# Patient Record
Sex: Male | Born: 1960
Health system: Southern US, Community
[De-identification: ages and names within clinical notes are randomized; demographics above are authoritative.]

## PROBLEM LIST (undated history)

## (undated) DIAGNOSIS — F329 Major depressive disorder, single episode, unspecified: Secondary | ICD-10-CM

## (undated) DIAGNOSIS — F32A Depression, unspecified: Secondary | ICD-10-CM

## (undated) DIAGNOSIS — I10 Essential (primary) hypertension: Secondary | ICD-10-CM

## (undated) DIAGNOSIS — D589 Hereditary hemolytic anemia, unspecified: Secondary | ICD-10-CM

## (undated) DIAGNOSIS — D693 Immune thrombocytopenic purpura: Secondary | ICD-10-CM

## (undated) HISTORY — DX: Hereditary hemolytic anemia, unspecified: D58.9

---

## 2006-08-25 ENCOUNTER — Inpatient Hospital Stay (HOSPITAL_COMMUNITY): Admission: AD | Admit: 2006-08-25 | Discharge: 2006-08-30 | Payer: Self-pay | Admitting: Internal Medicine

## 2006-08-30 ENCOUNTER — Ambulatory Visit: Payer: Self-pay | Admitting: Hematology & Oncology

## 2006-08-31 ENCOUNTER — Ambulatory Visit: Payer: Self-pay | Admitting: Oncology

## 2006-09-02 LAB — CBC WITH DIFFERENTIAL (CANCER CENTER ONLY)
BASO#: 0.1 10*3/uL (ref 0.0–0.2)
HCT: 49.1 % (ref 38.7–49.9)
HGB: 16.5 g/dL (ref 13.0–17.1)
LYMPH#: 1.1 10*3/uL (ref 0.9–3.3)
MONO#: 0.6 10*3/uL (ref 0.1–0.9)
NEUT%: 86.1 % — ABNORMAL HIGH (ref 40.0–80.0)
RBC: 5.46 10*6/uL (ref 4.20–5.70)
WBC: 14 10*3/uL — ABNORMAL HIGH (ref 4.0–10.0)

## 2006-09-02 LAB — COMPREHENSIVE METABOLIC PANEL
ALT: 29 U/L (ref 0–53)
AST: 29 U/L (ref 0–37)
Creatinine, Ser: 1.24 mg/dL (ref 0.40–1.50)
Total Bilirubin: 0.7 mg/dL (ref 0.3–1.2)

## 2006-09-10 LAB — CBC WITH DIFFERENTIAL (CANCER CENTER ONLY)
BASO#: 0.2 10*3/uL (ref 0.0–0.2)
EOS%: 1.4 % (ref 0.0–7.0)
Eosinophils Absolute: 0.2 10*3/uL (ref 0.0–0.5)
HCT: 44.1 % (ref 38.7–49.9)
HGB: 15 g/dL (ref 13.0–17.1)
LYMPH#: 2 10*3/uL (ref 0.9–3.3)
MCH: 30.7 pg (ref 28.0–33.4)
MCHC: 34 g/dL (ref 32.0–35.9)
MONO%: 5 % (ref 0.0–13.0)
NEUT%: 79.2 % (ref 40.0–80.0)

## 2006-09-17 ENCOUNTER — Ambulatory Visit: Payer: Self-pay | Admitting: Oncology

## 2006-09-17 LAB — CBC WITH DIFFERENTIAL/PLATELET
Basophils Absolute: 0.1 10*3/uL (ref 0.0–0.1)
EOS%: 0.1 % (ref 0.0–7.0)
HCT: 42 % (ref 38.7–49.9)
HGB: 15 g/dL (ref 13.0–17.1)
LYMPH%: 6.8 % — ABNORMAL LOW (ref 14.0–48.0)
MCH: 31.3 pg (ref 28.0–33.4)
MCV: 87.5 fL (ref 81.6–98.0)
MONO%: 1.4 % (ref 0.0–13.0)
NEUT%: 91.1 % — ABNORMAL HIGH (ref 40.0–75.0)
Platelets: 309 10*3/uL (ref 145–400)
lymph#: 0.8 10*3/uL — ABNORMAL LOW (ref 0.9–3.3)

## 2006-09-24 LAB — CBC WITH DIFFERENTIAL/PLATELET
Basophils Absolute: 0 10*3/uL (ref 0.0–0.1)
EOS%: 0.4 % (ref 0.0–7.0)
HGB: 14.6 g/dL (ref 13.0–17.1)
MCH: 31.4 pg (ref 28.0–33.4)
MCV: 88.2 fL (ref 81.6–98.0)
MONO%: 2.6 % (ref 0.0–13.0)
RDW: 13.7 % (ref 11.2–14.6)

## 2006-09-26 ENCOUNTER — Ambulatory Visit: Payer: Self-pay | Admitting: Oncology

## 2006-10-01 LAB — CBC WITH DIFFERENTIAL/PLATELET
BASO%: 0.2 % (ref 0.0–2.0)
EOS%: 0.5 % (ref 0.0–7.0)
Eosinophils Absolute: 0 10*3/uL (ref 0.0–0.5)
MCH: 31 pg (ref 28.0–33.4)
MCV: 87.7 fL (ref 81.6–98.0)
MONO%: 4.5 % (ref 0.0–13.0)
NEUT#: 6.7 10*3/uL — ABNORMAL HIGH (ref 1.5–6.5)
RBC: 4.61 10*6/uL (ref 4.20–5.71)
RDW: 13.7 % (ref 11.2–14.6)

## 2006-10-09 LAB — CBC WITH DIFFERENTIAL (CANCER CENTER ONLY)
BASO#: 0.1 10*3/uL (ref 0.0–0.2)
EOS%: 2.8 % (ref 0.0–7.0)
HGB: 14.6 g/dL (ref 13.0–17.1)
LYMPH%: 30 % (ref 14.0–48.0)
MCH: 31.1 pg (ref 28.0–33.4)
MCHC: 33.9 g/dL (ref 32.0–35.9)
MCV: 92 fL (ref 82–98)
MONO%: 7.9 % (ref 0.0–13.0)
NEUT#: 3.7 10*3/uL (ref 1.5–6.5)

## 2006-10-09 LAB — BASIC METABOLIC PANEL
CO2: 28 mEq/L (ref 19–32)
Calcium: 9.8 mg/dL (ref 8.4–10.5)
Glucose, Bld: 143 mg/dL — ABNORMAL HIGH (ref 70–99)
Sodium: 138 mEq/L (ref 135–145)

## 2006-10-15 LAB — CBC WITH DIFFERENTIAL/PLATELET
BASO%: 1.5 % (ref 0.0–2.0)
EOS%: 1.9 % (ref 0.0–7.0)
HCT: 41.5 % (ref 38.7–49.9)
LYMPH%: 23.2 % (ref 14.0–48.0)
MCH: 31.2 pg (ref 28.0–33.4)
MCHC: 35.2 g/dL (ref 32.0–35.9)
MCV: 88.4 fL (ref 81.6–98.0)
MONO%: 11.2 % (ref 0.0–13.0)
NEUT%: 62.2 % (ref 40.0–75.0)
lymph#: 1.5 10*3/uL (ref 0.9–3.3)

## 2006-10-22 LAB — CBC WITH DIFFERENTIAL/PLATELET
BASO%: 0.8 % (ref 0.0–2.0)
EOS%: 2.3 % (ref 0.0–7.0)
HGB: 14.8 g/dL (ref 13.0–17.1)
MCH: 31 pg (ref 28.0–33.4)
MCHC: 35.4 g/dL (ref 32.0–35.9)
MONO%: 13.1 % — ABNORMAL HIGH (ref 0.0–13.0)
RBC: 4.78 10*6/uL (ref 4.20–5.71)
RDW: 13 % (ref 11.2–14.6)
lymph#: 1.7 10*3/uL (ref 0.9–3.3)

## 2006-10-27 ENCOUNTER — Ambulatory Visit: Payer: Self-pay | Admitting: Oncology

## 2006-10-29 LAB — CBC WITH DIFFERENTIAL/PLATELET
Basophils Absolute: 0.1 10*3/uL (ref 0.0–0.1)
EOS%: 2.6 % (ref 0.0–7.0)
Eosinophils Absolute: 0.1 10*3/uL (ref 0.0–0.5)
HGB: 14.7 g/dL (ref 13.0–17.1)
NEUT#: 3 10*3/uL (ref 1.5–6.5)
RBC: 4.68 10*6/uL (ref 4.20–5.71)
RDW: 13.1 % (ref 11.2–14.6)
WBC: 5.3 10*3/uL (ref 4.0–10.0)
lymph#: 1.6 10*3/uL (ref 0.9–3.3)

## 2006-11-26 ENCOUNTER — Ambulatory Visit: Payer: Self-pay | Admitting: Oncology

## 2006-11-30 LAB — CBC WITH DIFFERENTIAL (CANCER CENTER ONLY)
BASO%: 0.7 % (ref 0.0–2.0)
EOS%: 3.3 % (ref 0.0–7.0)
Eosinophils Absolute: 0.2 10*3/uL (ref 0.0–0.5)
LYMPH%: 33.4 % (ref 14.0–48.0)
MCH: 30.2 pg (ref 28.0–33.4)
MCHC: 34 g/dL (ref 32.0–35.9)
MCV: 89 fL (ref 82–98)
MONO%: 6.7 % (ref 0.0–13.0)
NEUT#: 2.8 10*3/uL (ref 1.5–6.5)
Platelets: 280 10*3/uL (ref 145–400)
RBC: 5 10*6/uL (ref 4.20–5.70)
RDW: 11.1 % (ref 10.5–14.6)

## 2007-05-19 ENCOUNTER — Ambulatory Visit: Payer: Self-pay | Admitting: Oncology

## 2007-05-20 LAB — CBC WITH DIFFERENTIAL (CANCER CENTER ONLY)
BASO%: 1.4 % (ref 0.0–2.0)
Eosinophils Absolute: 0.1 10*3/uL (ref 0.0–0.5)
LYMPH%: 24.2 % (ref 14.0–48.0)
MONO#: 0.4 10*3/uL (ref 0.1–0.9)
NEUT#: 3.4 10*3/uL (ref 1.5–6.5)
Platelets: 305 10*3/uL (ref 145–400)
RBC: 5.29 10*6/uL (ref 4.20–5.70)
RDW: 12.3 % (ref 10.5–14.6)
WBC: 5.3 10*3/uL (ref 4.0–10.0)

## 2007-05-21 LAB — ANA: Anti Nuclear Antibody(ANA): NEGATIVE

## 2007-11-03 ENCOUNTER — Ambulatory Visit: Payer: Self-pay | Admitting: Oncology

## 2007-11-04 LAB — CBC WITH DIFFERENTIAL (CANCER CENTER ONLY)
BASO%: 1 % (ref 0.0–2.0)
HCT: 45.6 % (ref 38.7–49.9)
LYMPH#: 1.4 10*3/uL (ref 0.9–3.3)
MONO#: 0.3 10*3/uL (ref 0.1–0.9)
Platelets: 297 10*3/uL (ref 145–400)
RDW: 12.8 % (ref 10.5–14.6)
WBC: 5.2 10*3/uL (ref 4.0–10.0)

## 2008-04-06 IMAGING — CT CT ABDOMEN W/ CM
2 of 6 series · 17 of 46 positions shown, 19 images · IV contrast (APPLIED)
Comparison: none

CLINICAL DATA: Idiopathic thrombocytopenic Margarita.  Anemia.  Assess spleen. 
 ABDOMEN CT WITH CONTRAST:
TECHNIQUE: Multidetector CT imaging of the abdomen was performed following the standard protocol during bolus administration of intravenous contrast.
 Contrast:  100 cc Omnipaque 300.  Oral contrast was also given.

[Series 2: abd with 5.0 b31f st · axial · 0.71mm/px · z∈[-290,-80]mm · 14 of 50 slices shown, 16 images]
[im 4/50  soft-tissue]
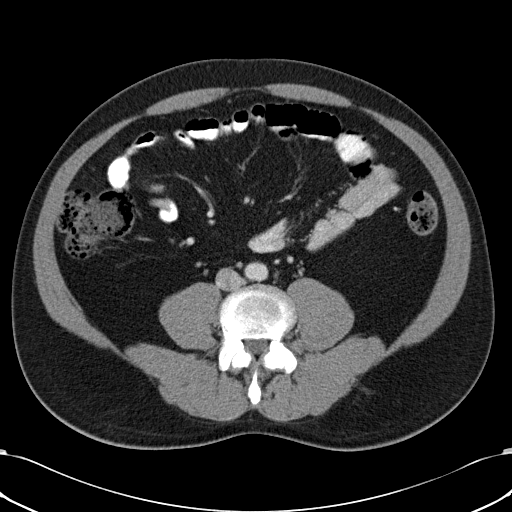
[im 4/50  bone]
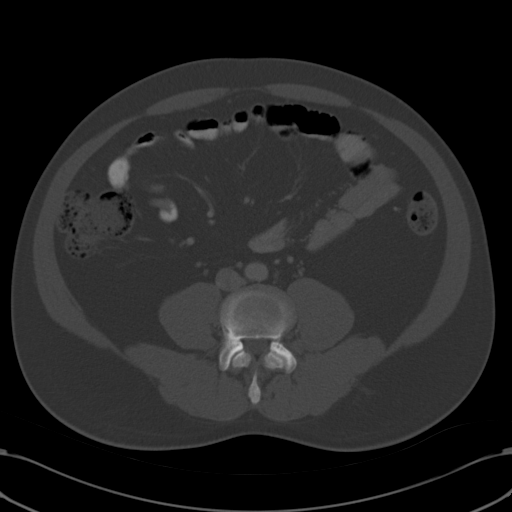
[im 7/50  soft-tissue]
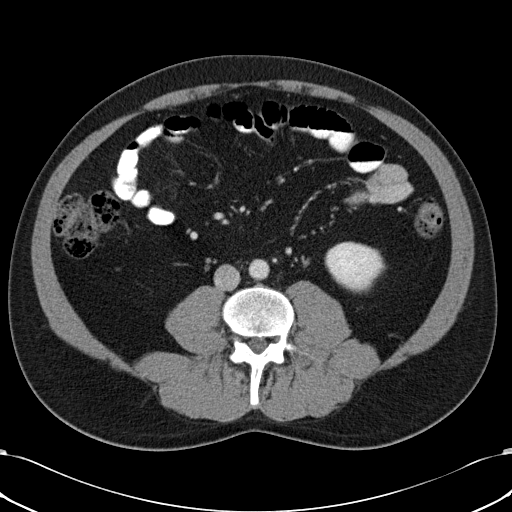
[im 10/50  soft-tissue]
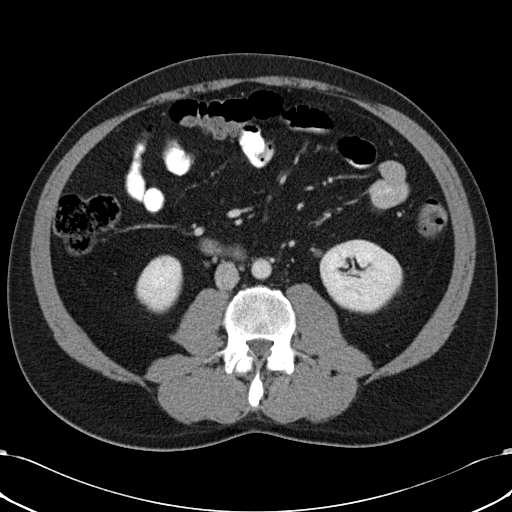
[im 14/50  soft-tissue]
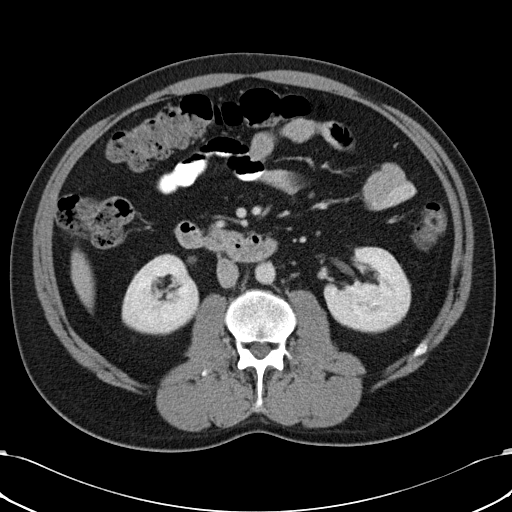
[im 17/50  soft-tissue]
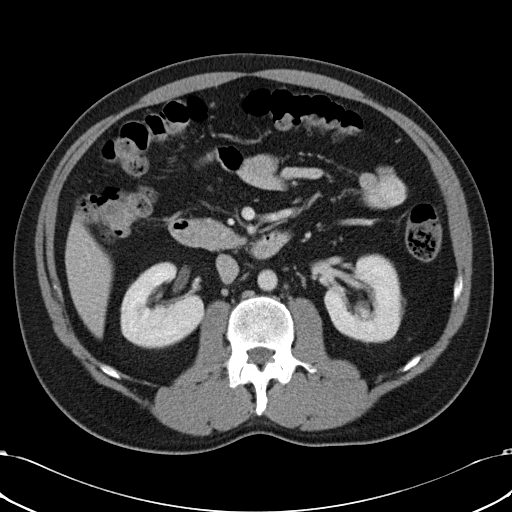
[im 20/50  soft-tissue]
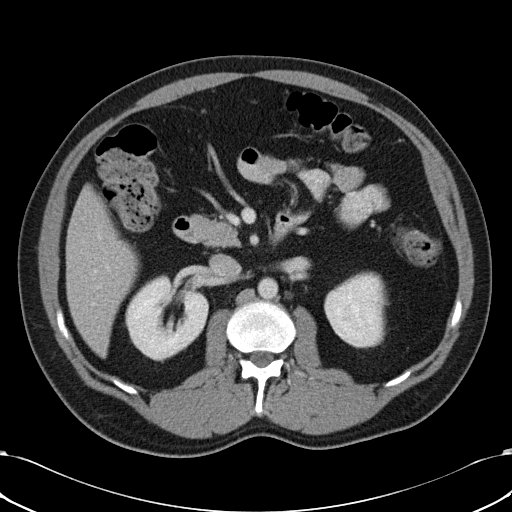
[im 23/50  soft-tissue]
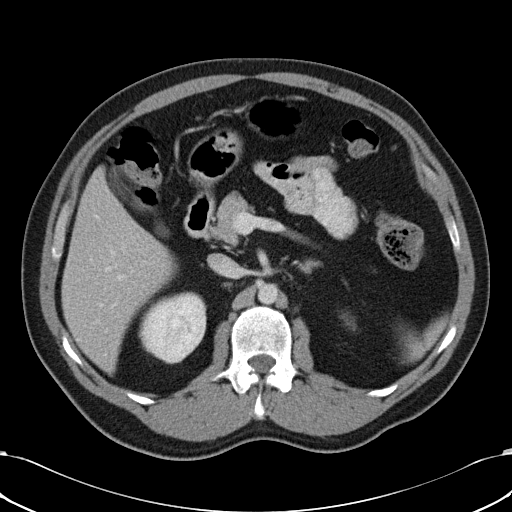
[im 27/50  soft-tissue]
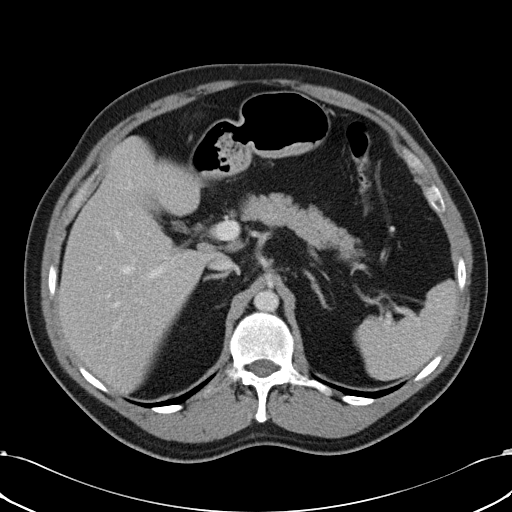
[im 30/50  soft-tissue]
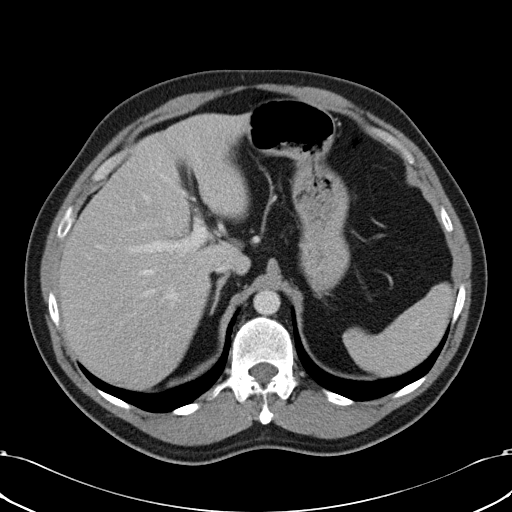
[im 30/50  bone]
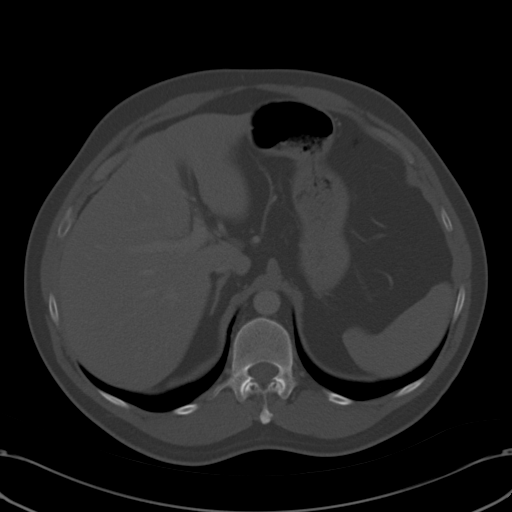
[im 33/50  soft-tissue]
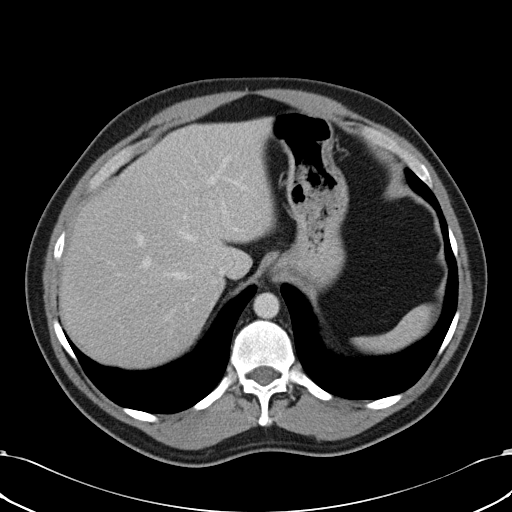
[im 36/50  soft-tissue]
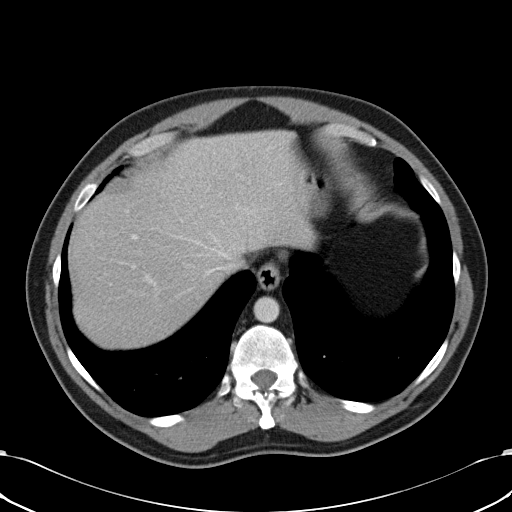
[im 40/50  soft-tissue]
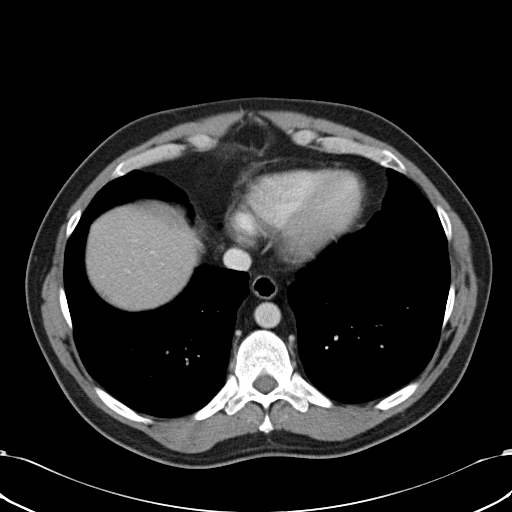
[im 43/50  soft-tissue]
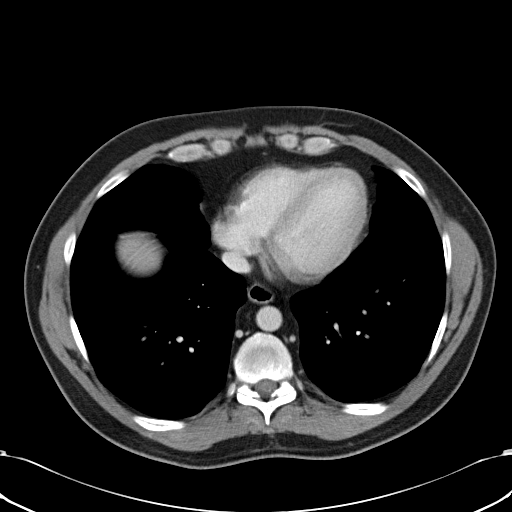
[im 46/50  soft-tissue]
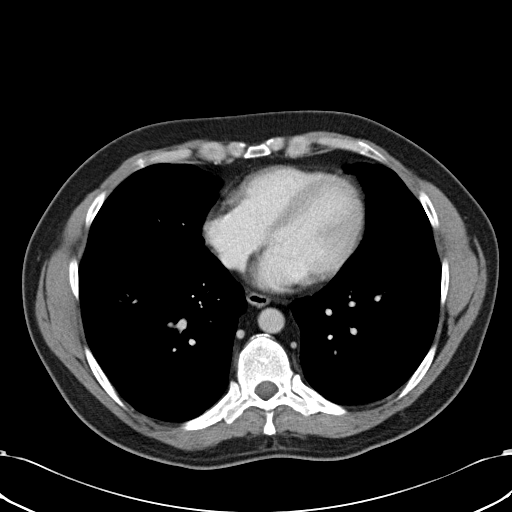

[Series 8: abd with 2.0 spo cor st · coronal · 0.68mm/px · 3 of 132 slices shown]
[im 27/132  soft-tissue]
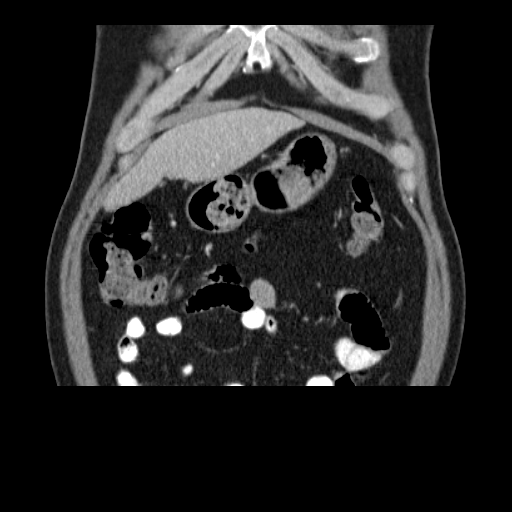
[im 53/132  soft-tissue]
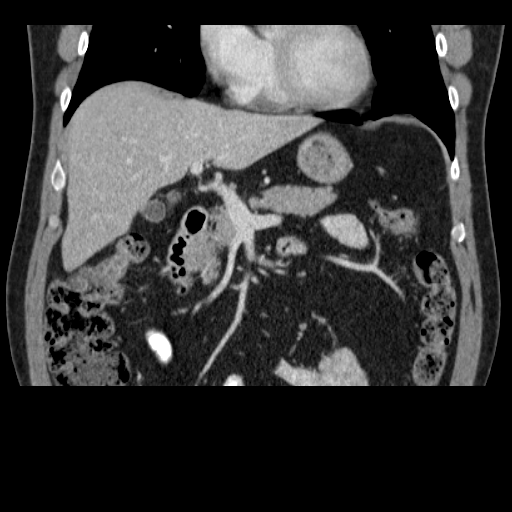
[im 79/132  soft-tissue]
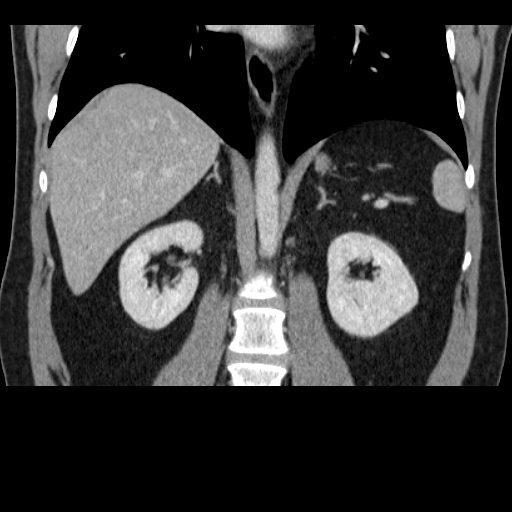

[17 of 46 positions shown; findings below may reference images not displayed]

FINDINGS: The spleen is normal in size and appearance.  A tiny cyst is seen in the dome of the liver.  The liver is otherwise unremarkable.   The pancreas, adrenal glands, kidneys and gallbladder are normal in appearance.  
 There is no evidence of soft tissue masses or adenopathy.  There is no evidence of inflammatory process or abnormal fluid collections.  Abdominal bowel loops are unremarkable.
IMPRESSION: Negative abdomen CT.  No evidence of splenomegaly or adenopathy.

## 2008-10-09 ENCOUNTER — Ambulatory Visit: Payer: Self-pay | Admitting: Oncology

## 2008-10-17 ENCOUNTER — Inpatient Hospital Stay (HOSPITAL_COMMUNITY): Admission: AD | Admit: 2008-10-17 | Discharge: 2008-10-18 | Payer: Self-pay | Admitting: Internal Medicine

## 2008-10-19 ENCOUNTER — Ambulatory Visit: Payer: Self-pay | Admitting: Oncology

## 2008-10-20 LAB — CBC WITH DIFFERENTIAL (CANCER CENTER ONLY)
BASO%: 0.9 % (ref 0.0–2.0)
LYMPH%: 22.2 % (ref 14.0–48.0)
MCV: 85 fL (ref 82–98)
MONO#: 0.8 10*3/uL (ref 0.1–0.9)
Platelets: 168 10*3/uL (ref 145–400)
RDW: 13.2 % (ref 10.5–14.6)
WBC: 7.4 10*3/uL (ref 4.0–10.0)

## 2008-10-20 LAB — COMPREHENSIVE METABOLIC PANEL
ALT: 19 U/L (ref 0–53)
Alkaline Phosphatase: 57 U/L (ref 39–117)
CO2: 22 mEq/L (ref 19–32)
Creatinine, Ser: 1.24 mg/dL (ref 0.40–1.50)
Sodium: 135 mEq/L (ref 135–145)
Total Bilirubin: 0.9 mg/dL (ref 0.3–1.2)

## 2008-10-26 ENCOUNTER — Ambulatory Visit: Payer: Self-pay | Admitting: Oncology

## 2008-10-26 LAB — CBC WITH DIFFERENTIAL/PLATELET
BASO%: 0.1 % (ref 0.0–2.0)
Eosinophils Absolute: 0 10*3/uL (ref 0.0–0.5)
HCT: 41.6 % (ref 38.4–49.9)
LYMPH%: 10.3 % — ABNORMAL LOW (ref 14.0–49.0)
MCHC: 34.5 g/dL (ref 32.0–36.0)
MCV: 88.7 fL (ref 79.3–98.0)
MONO#: 0.3 10*3/uL (ref 0.1–0.9)
MONO%: 2.4 % (ref 0.0–14.0)
NEUT%: 87.2 % — ABNORMAL HIGH (ref 39.0–75.0)
Platelets: 591 10*3/uL — ABNORMAL HIGH (ref 140–400)
WBC: 12 10*3/uL — ABNORMAL HIGH (ref 4.0–10.3)

## 2008-10-26 LAB — BASIC METABOLIC PANEL
CO2: 25 mEq/L (ref 19–32)
Calcium: 8.9 mg/dL (ref 8.4–10.5)
Creatinine, Ser: 1.44 mg/dL (ref 0.40–1.50)
Glucose, Bld: 123 mg/dL — ABNORMAL HIGH (ref 70–99)

## 2008-11-02 LAB — CBC WITH DIFFERENTIAL/PLATELET
BASO%: 0 % (ref 0.0–2.0)
EOS%: 0 % (ref 0.0–7.0)
MCH: 30.9 pg (ref 27.2–33.4)
MCHC: 34.7 g/dL (ref 32.0–36.0)
MCV: 89.1 fL (ref 79.3–98.0)
MONO%: 0.8 % (ref 0.0–14.0)
RBC: 4.67 10*6/uL (ref 4.20–5.82)
RDW: 14.1 % (ref 11.0–14.6)
lymph#: 0.5 10*3/uL — ABNORMAL LOW (ref 0.9–3.3)

## 2008-11-09 LAB — CBC WITH DIFFERENTIAL/PLATELET
Basophils Absolute: 0 10*3/uL (ref 0.0–0.1)
EOS%: 0.8 % (ref 0.0–7.0)
Eosinophils Absolute: 0.1 10*3/uL (ref 0.0–0.5)
HGB: 14.1 g/dL (ref 13.0–17.1)
MONO%: 4.7 % (ref 0.0–14.0)
NEUT#: 7.3 10*3/uL — ABNORMAL HIGH (ref 1.5–6.5)
RBC: 4.41 10*6/uL (ref 4.20–5.82)
RDW: 13.7 % (ref 11.0–14.6)
lymph#: 0.9 10*3/uL (ref 0.9–3.3)

## 2008-11-15 LAB — CBC WITH DIFFERENTIAL (CANCER CENTER ONLY)
BASO#: 0.1 10*3/uL (ref 0.0–0.2)
Eosinophils Absolute: 0.4 10*3/uL (ref 0.0–0.5)
HCT: 40.4 % (ref 38.7–49.9)
HGB: 14.1 g/dL (ref 13.0–17.1)
LYMPH#: 1.9 10*3/uL (ref 0.9–3.3)
LYMPH%: 30 % (ref 14.0–48.0)
MCV: 88 fL (ref 82–98)
MONO#: 0.6 10*3/uL (ref 0.1–0.9)
NEUT%: 54.8 % (ref 40.0–80.0)
RDW: 12.9 % (ref 10.5–14.6)
WBC: 6.4 10*3/uL (ref 4.0–10.0)

## 2008-11-15 LAB — BASIC METABOLIC PANEL - CANCER CENTER ONLY
BUN, Bld: 15 mg/dL (ref 7–22)
Chloride: 107 mEq/L (ref 98–108)
Glucose, Bld: 130 mg/dL — ABNORMAL HIGH (ref 73–118)
Potassium: 3.7 mEq/L (ref 3.3–4.7)
Sodium: 143 mEq/L (ref 128–145)

## 2008-11-22 LAB — CBC WITH DIFFERENTIAL/PLATELET
Eosinophils Absolute: 0.2 10*3/uL (ref 0.0–0.5)
HCT: 40.8 % (ref 38.4–49.9)
LYMPH%: 24.8 % (ref 14.0–49.0)
MCV: 89.1 fL (ref 79.3–98.0)
MONO#: 0.5 10*3/uL (ref 0.1–0.9)
MONO%: 8.8 % (ref 0.0–14.0)
NEUT#: 3.5 10*3/uL (ref 1.5–6.5)
NEUT%: 60.7 % (ref 39.0–75.0)
Platelets: 320 10*3/uL (ref 140–400)
RBC: 4.58 10*6/uL (ref 4.20–5.82)
WBC: 5.8 10*3/uL (ref 4.0–10.3)

## 2008-11-29 ENCOUNTER — Ambulatory Visit: Payer: Self-pay | Admitting: Oncology

## 2008-11-29 LAB — CBC WITH DIFFERENTIAL/PLATELET
BASO%: 1 % (ref 0.0–2.0)
EOS%: 2.2 % (ref 0.0–7.0)
HCT: 40 % (ref 38.4–49.9)
LYMPH%: 24.8 % (ref 14.0–49.0)
MCH: 31.2 pg (ref 27.2–33.4)
MCHC: 35.1 g/dL (ref 32.0–36.0)
MCV: 88.9 fL (ref 79.3–98.0)
MONO#: 0.6 10*3/uL (ref 0.1–0.9)
MONO%: 11.1 % (ref 0.0–14.0)
NEUT%: 60.9 % (ref 39.0–75.0)
Platelets: 381 10*3/uL (ref 140–400)
RBC: 4.5 10*6/uL (ref 4.20–5.82)

## 2008-12-06 LAB — CBC WITH DIFFERENTIAL/PLATELET
BASO%: 0.4 % (ref 0.0–2.0)
EOS%: 0.6 % (ref 0.0–7.0)
HCT: 41.9 % (ref 38.4–49.9)
LYMPH%: 14.5 % (ref 14.0–49.0)
MCH: 30.9 pg (ref 27.2–33.4)
MCHC: 34.7 g/dL (ref 32.0–36.0)
MCV: 89.3 fL (ref 79.3–98.0)
MONO%: 4 % (ref 0.0–14.0)
NEUT%: 80.5 % — ABNORMAL HIGH (ref 39.0–75.0)
Platelets: 358 10*3/uL (ref 140–400)
lymph#: 1.2 10*3/uL (ref 0.9–3.3)

## 2009-01-04 ENCOUNTER — Ambulatory Visit: Payer: Self-pay | Admitting: Oncology

## 2009-01-08 LAB — CBC WITH DIFFERENTIAL (CANCER CENTER ONLY)
BASO%: 1.2 % (ref 0.0–2.0)
Eosinophils Absolute: 0.2 10*3/uL (ref 0.0–0.5)
HCT: 45.1 % (ref 38.7–49.9)
LYMPH#: 1.8 10*3/uL (ref 0.9–3.3)
LYMPH%: 28.8 % (ref 14.0–48.0)
MCV: 87 fL (ref 82–98)
MONO#: 0.6 10*3/uL (ref 0.1–0.9)
Platelets: 356 10*3/uL (ref 145–400)
RBC: 5.17 10*6/uL (ref 4.20–5.70)
RDW: 12 % (ref 10.5–14.6)
WBC: 6.2 10*3/uL (ref 4.0–10.0)

## 2009-01-08 LAB — BASIC METABOLIC PANEL - CANCER CENTER ONLY
CO2: 29 mEq/L (ref 18–33)
Calcium: 9.4 mg/dL (ref 8.0–10.3)
Chloride: 100 mEq/L (ref 98–108)
Glucose, Bld: 90 mg/dL (ref 73–118)
Sodium: 139 mEq/L (ref 128–145)

## 2009-06-11 ENCOUNTER — Ambulatory Visit: Payer: Self-pay | Admitting: Oncology

## 2009-06-11 LAB — CBC WITH DIFFERENTIAL/PLATELET
Eosinophils Absolute: 0.1 10*3/uL (ref 0.0–0.5)
HCT: 43.5 % (ref 38.4–49.9)
LYMPH%: 27.5 % (ref 14.0–49.0)
MCV: 89.4 fL (ref 79.3–98.0)
MONO#: 0.6 10*3/uL (ref 0.1–0.9)
MONO%: 10.5 % (ref 0.0–14.0)
NEUT#: 3.2 10*3/uL (ref 1.5–6.5)
NEUT%: 58.5 % (ref 39.0–75.0)
Platelets: 279 10*3/uL (ref 140–400)
WBC: 5.4 10*3/uL (ref 4.0–10.3)

## 2010-01-02 ENCOUNTER — Ambulatory Visit: Payer: Self-pay | Admitting: Oncology

## 2010-01-08 LAB — CBC WITH DIFFERENTIAL (CANCER CENTER ONLY)
BASO%: 1.2 % (ref 0.0–2.0)
EOS%: 3.5 % (ref 0.0–7.0)
HCT: 45.3 % (ref 38.7–49.9)
LYMPH%: 23.2 % (ref 14.0–48.0)
MCH: 29.9 pg (ref 28.0–33.4)
MCHC: 34.4 g/dL (ref 32.0–35.9)
MCV: 87 fL (ref 82–98)
MONO%: 6.2 % (ref 0.0–13.0)
NEUT%: 65.9 % (ref 40.0–80.0)
RDW: 12.2 % (ref 10.5–14.6)

## 2010-01-08 LAB — CMP (CANCER CENTER ONLY)
ALT(SGPT): 32 U/L (ref 10–47)
Alkaline Phosphatase: 71 U/L (ref 26–84)
CO2: 23 mEq/L (ref 18–33)
Creat: 1.2 mg/dl (ref 0.6–1.2)
Total Bilirubin: 0.6 mg/dl (ref 0.20–1.60)

## 2010-09-10 LAB — COMPREHENSIVE METABOLIC PANEL
Albumin: 4 g/dL (ref 3.5–5.2)
BUN: 11 mg/dL (ref 6–23)
CO2: 28 mEq/L (ref 19–32)
Chloride: 102 mEq/L (ref 96–112)
Creatinine, Ser: 1.18 mg/dL (ref 0.4–1.5)
GFR calc non Af Amer: >60 mL/min (ref >60)
Total Bilirubin: 0.7 mg/dL (ref 0.3–1.2)

## 2010-09-10 LAB — BASIC METABOLIC PANEL
Calcium: 9.1 mg/dL (ref 8.4–10.5)
Creatinine, Ser: 1.15 mg/dL (ref 0.4–1.5)
GFR calc Af Amer: >60 mL/min (ref >60)

## 2010-09-10 LAB — CBC
HCT: 45.8 % (ref 39.0–52.0)
MCV: 87.9 fL (ref 78.0–100.0)
Platelets: 13 10*3/uL — CL (ref 150–400)
RBC: 4.81 MIL/uL (ref 4.22–5.81)
WBC: 7.7 10*3/uL (ref 4.0–10.5)
WBC: 7.9 10*3/uL (ref 4.0–10.5)

## 2010-09-10 LAB — PROTIME-INR
INR: 0.9 (ref 0.00–1.49)
Prothrombin Time: 12.6 seconds (ref 11.6–15.2)

## 2010-09-10 LAB — DIFFERENTIAL
Basophils Absolute: 0.1 10*3/uL (ref 0.0–0.1)
Lymphocytes Relative: 22 % (ref 12–46)
Neutro Abs: 5.2 10*3/uL (ref 1.7–7.7)

## 2010-09-10 LAB — APTT: aPTT: 22 seconds — ABNORMAL LOW (ref 24–37)

## 2010-10-15 NOTE — H&P (Signed)
NAMENORTH, ESTERLINE             ACCOUNT NO.:  [id number]   MEDICAL RECORD NO.:  [id number]          PATIENT TYPE:  INP   LOCATION:  1311                         FACILITY:  [facility]   PHYSICIAN:  Corinna L. Lendell Caprice, MDDATE OF BIRTH:  09-25-1960   DATE OF ADMISSION:  10/17/2008  DATE OF DISCHARGE:                              HISTORY & PHYSICAL   CHIEF COMPLAINT:  Rash, low platelets.   HISTORY OF PRESENT ILLNESS:  Mr. Bowker is a pleasant 50 year old  white male with a history of ITP in 2008, treated with steroids and IV  immunoglobulin.  He has been well until this week when he noticed some  dark stools.  Apparently he attributed them to eating a lot of spinach  salad.  He has had no frank hematochezia.  He has had no bleeding that  he knows of.  He did notice beginning yesterday a petechial rash  starting in his antecubital areas which has worsened today.  There are a  few petechiae on his trunk.  He was seen by Dr. Paulino Rily in the office.  A CBC was done which showed a platelet count of 14,000.  Dr. Laurine Blazer  apparently contacted hematology and arranged for direct admission.  A  platelet count was 326,000 in December 2009.  He had followed up with  Dr. Welton Flakes after last hospitalization and had been released due to stable  platelet count off steroids.   PAST MEDICAL HISTORY:  1. As above.  2. Hypertension.  3. Anxiety.   SOCIAL HISTORY:  The patient is here with his mother.  He works for [employer] as a Museum/gallery conservator.  He denies excessive alcohol use.  He  does not smoke or use drugs.   FAMILY HISTORY:  His father and aunt both had ITP.   REVIEW OF SYSTEMS:  As above, otherwise negative.   PHYSICAL EXAMINATION:  Temperature is 98.2, pulse 75, respiratory rate  18, blood pressure 138/90, oxygen saturation 95% on room air.  He weighs  81.7 kg.  GENERAL:  The patient is in no acute distress.  HEENT:  Normocephalic, atraumatic.  Pupils equal, round, and reactive to  light.  Sclerae nonicteric.  Moist mucous membranes.  Conjunctivae pink.  NECK:  Supple.  No lymphadenopathy.  LUNGS:  Clear to auscultation bilaterally without wheezes, rhonchi or  rales.  CARDIOVASCULAR:  Regular rate and rhythm without murmurs,  gallops or rubs. ABDOMEN:  Normal bowel sounds, soft, nontender, no  splenomegaly.  GU AND RECTAL:  Deferred.  EXTREMITIES:  No clubbing, cyanosis or edema.  SKIN:  He has a few nonpalpable petechial areas, particularly in the  antecubital areas.  He has an isolated petechia on his torso, all of  which are nonpalpable.  NEUROLOGIC:  Alert and oriented.  Cranial nerves and sensory motor exam  are intact.  PSYCHIATRIC:  Normal affect.   LABORATORIES:  White blood cell count in the office was 5.4, hemoglobin  15.4, platelet count 14,000, RDW 12.9, MCV 86.3.   ASSESSMENT AND PLAN:  1. Recurrent thrombocytopenia, presumed recurrent idiopathic      thrombocytopenia purpura:  I will give steroids.  Check a repeat      CBC with differential, PT/PTT, complete metabolic panel and      peripheral smear.  He will get steroids.  I will place a saline      lock should he experience any bleeding or require transfusion.  He      is not actively bleeding currently.  I have consulted hematology.      For now hold off on any IV immunoglobulin, but this may need be      needed, defer to Dr. Arline Asp.  2. Hypertension.  Continue outpatient medications:  Apparently during      his last hospitalization his medications have not changed nor were      they felt to be the etiology of his thrombocytopenia.  3. Anxiety.      Corinna L. Lendell Caprice, MD  Electronically Signed     CLS/MEDQ  D:  10/17/2008  T:  10/17/2008  Job:  [id number]   cc:   Emeterio Reeve, MD  Fax: [phone]   Drue Second, MD  Fax: [phone]   Samul Dada, M.D.  Fax: [phone]

## 2010-10-15 NOTE — Consult Note (Signed)
NAMENELSON, NOONE             ACCOUNT NO.:  [id number]   MEDICAL RECORD NO.:  [id number]          PATIENT TYPE:  INP   LOCATION:  1311                         FACILITY:  [facility]   PHYSICIAN:  Drue Second, MD       DATE OF BIRTH:  1961/02/26   DATE OF CONSULTATION:  10/17/2008  DATE OF DISCHARGE:                                 CONSULTATION   REASON FOR CONSULTATION:  Thrombocytopenia.   HISTORY OF PRESENT ILLNESS:  Mr. Speros is a 50 year old gentleman who  has been followed by [place] in the past for ITP.  We initially met Mr.  Aden in March, 2008 during a hospitalization.  At that time, he  received IV Solu-Medrol and was later switched over to oral prednisone  along with IV Ig at 1 gm/kg during his hospitalization.  Patient had  normalization of his platelets, and he was eventually tapered off of his  prednisone.  Patient presented to his primary care Naja Apperson today with  complaints of bleeding under his skin.  The patient had petechiae  present for about 1 day, and he noticed that they were getting more  numerous.  He did not report any other bleeding.  His platelet count was  noted to be 14,000 at his primary care Anibal Quinby's office, and he was  sent to [facility] for a direct admission.   REVIEW OF SYSTEMS:  Patient complains of petechiae to his bilateral arms  and chest x1 day.  No other bleeding is noted.  He has no fevers,  chills, or night sweats.  No chest pain, shortness of breath, or  palpitations.  No abdominal pain.  No nausea or vomiting.  No  constipation or diarrhea.  No active bleeding.  No bruises are noted  with the exception of the petechiae.  He denies any recent illnesses.  No complaints of arthralgias.  The remainder of the review of systems is  negative.   PAST MEDICAL HISTORY:  1. ITP.  2. Anxiety disorder.  3. Hypertension.  4. Hyperlipidemia.   PAST SURGICAL HISTORY:  None.   SOCIAL HISTORY:  Patient is single.  He denies any  alcohol or tobacco  use.  He works full time for [employer].   FAMILY HISTORY:  Patient's father had ITP, which was treated with  prednisone and IV Ig without any recurrence.  The patient's father's  side of the family also has ITP in other family members.  No other  cancers or hematological disorders in the family.   ALLERGIES:  PENICILLIN.   CURRENT MEDICATIONS:  1. Prednisone 80 mg p.o. daily.  2. Ambien 5 mg p.o. at bedtime as needed for sleep.  3. Ativan 0.5 mg every 8 hours as needed for anxiety.  4. Paxil 20 mg p.o. daily.  5. Atenolol 25 mg p.o. daily.  6. Lisinopril/HCTZ 10/12.5 mg p.o. daily.   PHYSICAL EXAMINATION:  VITAL SIGNS:  Temp 98.2, pulse 75, blood pressure  138/90, respirations 18.  O2 sat is 95% on room air.  GENERAL:  Patient is awake and alert.  He is a well-appearing gentleman  who  is in no acute distress.  HEENT:  Oral mucosa is moist.  No petechiae are noted.  Post pharynx is  clear.  LYMPHATICS:  There is no palpable cervical, supraclavicular, axillary,  or inguinal lymphadenopathy.  LUNGS:  Clear to auscultation bilaterally.  No rales, wheezes, or  rhonchi noted.  CARDIOVASCULAR:  Regular rate and rhythm.  No murmurs, rubs or gallops.  ABDOMEN:  Soft.  Bowel sounds are present.  No palpable masses.  No  hepatosplenomegaly is noted.  EXTREMITIES:  No edema.  NEURO:  Patient is alert and oriented x3.  Cranial nerves II-XII are  grossly intact.  Strength is symmetrical in the upper and lower  extremities.  SKIN:  No rashes or jaundice; however, the patient does have petechiae  on his bilateral arms.  No petechiae are noted elsewhere on his body.   LABORATORY DATA:  From his primary care Shelsy Seng's office, drawn Oct 17, 2008, WBC 5.4, hemoglobin 15.4, platelets 14,000.  Labs were redrawn  upon admission on Oct 17, 2008.  The WBC is 7.7, hemoglobin 15.8,  hematocrit 45.8, platelets 13,000.  PT is 12.6, INR 0.9, PTT 22.   IMPRESSION:  This is a  50 year old gentleman with idiopathic  thrombocytopenic purpura.  The patient has been treated for idiopathic  thrombocytopenic purpura in the past with steroids and IV Ig.   PLAN:  1. We will continue the patient on prednisone 80 mg p.o. daily as      ordered.  We will taper this once the patient's platelet count      comes back up.  2. We will administer IV Ig at 1 gm/kg.  Patient will receive      premedications of Tylenol and Benadryl.  Patient has received this      medication in the past, and the risks and benefits have been      reviewed him, and he is in agreement to receiving this medication.  3. We will obtain a CT of the abdomen and pelvis with contrast to      evaluate for splenomegaly.  4. We will hold on any platelet transfusion at this time.  We will      plan to transfuse platelets only for active bleeding.  5. We will continue to follow Mr. Arduini during his hospitalization.   Thank you for this consult.      Myrtis Ser, NP      Drue Second, MD  Electronically Signed   KRC/MEDQ  D:  10/17/2008  T:  10/17/2008  Job:  [id number]   cc:   Emeterio Reeve, MD  Fax: [phone]

## 2010-10-18 NOTE — Discharge Summary (Signed)
Kenneth Juarez, Kenneth Juarez             ACCOUNT NO.:  [id number]   MEDICAL RECORD NO.:  [id number]          PATIENT TYPE:  INP   LOCATION:  6706                         FACILITY:  [facility]   PHYSICIAN:  Kela Millin, M.D.DATE OF BIRTH:  [birth year]   DATE OF ADMISSION:  08/25/2006  DATE OF DISCHARGE:  08/30/2006                               DISCHARGE SUMMARY   DATE OF BIRTH:  [birth year]   DISCHARGE DIAGNOSIS:  1. Idiopathic thrombocytopenic purpura - platelet count less than 5      upon admission, 87 today on discharge.  2. Hypertension.  3. Hypercholesterolemia.  4. Anxiety.   HISTORY:  The patient is a 50 year old male with a past medical history  significant for anxiety, hypertension and hypercholesterolemia, who  presented with complaints of feeling tired, weak and a petechial rash.  He reported that on the day of admission he noticed some red spots on  both of his arms, especially in the antecubital areas and when he was  shaving he noticed he was bleeding from the mouth, both sides, on his  face and neck and it took some time for the bleeding to stop.  He was  seen by his primary care physician and referred to the hospital for  admission.   His physical exam upon admission as per Dr. Rana Snare revealed a  temperature of 100 with a pulse of 91, respiratory rate of 20 and  pertinent findings on exam, on HEENT there were multiple fresh scabs on  several parts of his face and neck from bleeding early in the day.  On  his skin he had multiple petechiae, particularly on the flexor surfaces  of his upper extremities, nonblanching lesions.  No adenopathy in the  epitrochlea, axillary nor inguinal lymph nodes.   LABORATORY DATA:  His platelet count on admission - less than 5, his  hemoglobin 15.4, hematocrit 14.5.  Abdominal ultrasound within normal  limits.  No splenomegaly.   HOSPITAL COURSE:  1. Idiopathic thrombocytopenic purpura:  Upon admission the patient      was transfused  one unit of platelets and also was given IV Solu-      Medrol.  A DIC panel was also done and it was negative.  Blood      cultures were done and this did not grow any bacteria.  Hematology      was consulted and Dr. Welton Flakes saw the patient and agreed with the      above management.  His platelets were monitored and initially they      improved to about 16,000 but on recheck they were slightly      decreased to 13.  The patient was then given IVIG per hematologist      and maintained on the steroids.  The patient's rash has improved.      He has had no signs of bleeding throughout his hospital stay.  His      platelet count following the IVIG improved to 19 and his steroids      were changed  to p.o. and on recheck today his platelet count was  50.  The patient was seen by Hematology today and their      recommendations were that if platelets are greater than 20,000 to      discharge the patient home and he will be sent home today on oral      prednisone and he is to followup with Dr. Welton Flakes next week.  He will      call for an appointment.  The patient also had a CT scan of the      abdomen done while in the hospital and it was also negative for      splenomegaly.  2. Hypertension:  The patient was maintained on his out-patient      medications while in the hospital.  3. Anxiety:  The patient was maintained on Paxil during his hospital      stay.   DISCHARGE MEDICATIONS:  1. Prednisone 40 mg p.o. b.i.d. with food.  2. Prilosec 40 mg p.o. daily.  3. The patient to continue Atenolol, lisinopril/hydrochlorothiazide      and Paxil.   FOLLOWUP CARE:  1. Dr. Welton Flakes - patient to call in a.m. for appointment.  2. Dr. Johnn Hai with [facility] in one to two weeks.   DISCHARGE ACTIVITIES:  Improved - stable.      Kela Millin, M.D.  Electronically Signed     ACV/MEDQ  D:  08/30/2006  T:  08/30/2006  Job:  [id number]   cc:   Drue Second, MD

## 2010-12-09 ENCOUNTER — Encounter (HOSPITAL_BASED_OUTPATIENT_CLINIC_OR_DEPARTMENT_OTHER): Payer: 59 | Admitting: Oncology

## 2010-12-09 ENCOUNTER — Other Ambulatory Visit: Payer: Self-pay | Admitting: Oncology

## 2010-12-09 DIAGNOSIS — D693 Immune thrombocytopenic purpura: Secondary | ICD-10-CM

## 2010-12-09 LAB — CBC WITH DIFFERENTIAL/PLATELET
Eosinophils Absolute: 0.2 10*3/uL (ref 0.0–0.5)
HCT: 42.6 % (ref 38.4–49.9)
LYMPH%: 25.7 % (ref 14.0–49.0)
MONO#: 0.6 10*3/uL (ref 0.1–0.9)
NEUT#: 4.1 10*3/uL (ref 1.5–6.5)
NEUT%: 61.8 % (ref 39.0–75.0)
Platelets: 271 10*3/uL (ref 140–400)
RBC: 4.78 10*6/uL (ref 4.20–5.82)
WBC: 6.6 10*3/uL (ref 4.0–10.3)
lymph#: 1.7 10*3/uL (ref 0.9–3.3)

## 2013-04-25 ENCOUNTER — Encounter (HOSPITAL_COMMUNITY): Payer: Self-pay | Admitting: Emergency Medicine

## 2013-04-25 ENCOUNTER — Emergency Department (HOSPITAL_COMMUNITY)
Admission: EM | Admit: 2013-04-25 | Discharge: 2013-04-25 | Disposition: A | Discharge status: Home / Self Care | Payer: 59 | Attending: Emergency Medicine | Admitting: Emergency Medicine

## 2013-04-25 ENCOUNTER — Emergency Department (HOSPITAL_COMMUNITY): Payer: 59

## 2013-04-25 DIAGNOSIS — Z79899 Other long term (current) drug therapy: Secondary | ICD-10-CM | POA: Insufficient documentation

## 2013-04-25 DIAGNOSIS — Z862 Personal history of diseases of the blood and blood-forming organs and certain disorders involving the immune mechanism: Secondary | ICD-10-CM | POA: Insufficient documentation

## 2013-04-25 DIAGNOSIS — F329 Major depressive disorder, single episode, unspecified: Secondary | ICD-10-CM | POA: Insufficient documentation

## 2013-04-25 DIAGNOSIS — R0600 Dyspnea, unspecified: Secondary | ICD-10-CM

## 2013-04-25 DIAGNOSIS — R5381 Other malaise: Secondary | ICD-10-CM | POA: Insufficient documentation

## 2013-04-25 DIAGNOSIS — D649 Anemia, unspecified: Secondary | ICD-10-CM | POA: Insufficient documentation

## 2013-04-25 DIAGNOSIS — I10 Essential (primary) hypertension: Secondary | ICD-10-CM | POA: Insufficient documentation

## 2013-04-25 DIAGNOSIS — R0609 Other forms of dyspnea: Secondary | ICD-10-CM | POA: Insufficient documentation

## 2013-04-25 DIAGNOSIS — R0989 Other specified symptoms and signs involving the circulatory and respiratory systems: Secondary | ICD-10-CM | POA: Insufficient documentation

## 2013-04-25 DIAGNOSIS — F3289 Other specified depressive episodes: Secondary | ICD-10-CM | POA: Insufficient documentation

## 2013-04-25 HISTORY — DX: Depression, unspecified: F32.A

## 2013-04-25 HISTORY — DX: Major depressive disorder, single episode, unspecified: F32.9

## 2013-04-25 HISTORY — DX: Essential (primary) hypertension: I10

## 2013-04-25 HISTORY — DX: Immune thrombocytopenic purpura: D69.3

## 2013-04-25 LAB — COMPREHENSIVE METABOLIC PANEL WITH GFR
ALT: 30 U/L (ref 0–53)
AST: 27 U/L (ref 0–37)
Albumin: 4.1 g/dL (ref 3.5–5.2)
Alkaline Phosphatase: 79 U/L (ref 39–117)
BUN: 17 mg/dL (ref 6–23)
CO2: 22 meq/L (ref 19–32)
Calcium: 9.1 mg/dL (ref 8.4–10.5)
Chloride: 99 meq/L (ref 96–112)
Creatinine, Ser: 1.19 mg/dL (ref 0.50–1.35)
GFR calc Af Amer: 80 mL/min — ABNORMAL LOW
GFR calc non Af Amer: 69 mL/min — ABNORMAL LOW
Glucose, Bld: 104 mg/dL — ABNORMAL HIGH (ref 70–99)
Potassium: 4.1 meq/L (ref 3.5–5.1)
Sodium: 133 meq/L — ABNORMAL LOW (ref 135–145)
Total Bilirubin: 1.5 mg/dL — ABNORMAL HIGH (ref 0.3–1.2)
Total Protein: 6.9 g/dL (ref 6.0–8.3)

## 2013-04-25 LAB — CBC WITH DIFFERENTIAL/PLATELET
Basophils Absolute: 0.1 10*3/uL (ref 0.0–0.1)
Basophils Relative: 1 % (ref 0–1)
Eosinophils Absolute: 0 10*3/uL (ref 0.0–0.7)
Eosinophils Relative: 1 % (ref 0–5)
HCT: 30.4 % — ABNORMAL LOW (ref 39.0–52.0)
Hemoglobin: 10.7 g/dL — ABNORMAL LOW (ref 13.0–17.0)
Lymphocytes Relative: 15 % (ref 12–46)
Lymphs Abs: 1 10*3/uL (ref 0.7–4.0)
MCH: 31.5 pg (ref 26.0–34.0)
MCHC: 35.2 g/dL (ref 30.0–36.0)
MCV: 89.4 fL (ref 78.0–100.0)
Monocytes Absolute: 0.6 10*3/uL (ref 0.1–1.0)
Monocytes Relative: 9 % (ref 3–12)
Neutro Abs: 5.1 10*3/uL (ref 1.7–7.7)
Neutrophils Relative %: 75 % (ref 43–77)
Platelets: 289 10*3/uL (ref 150–400)
RBC: 3.4 MIL/uL — ABNORMAL LOW (ref 4.22–5.81)
RDW: 15.2 % (ref 11.5–15.5)
WBC: 6.8 10*3/uL (ref 4.0–10.5)

## 2013-04-25 LAB — POCT I-STAT TROPONIN I: Troponin i, poc: 0 ng/mL (ref 0.00–0.08)

## 2013-04-25 LAB — D-DIMER, QUANTITATIVE: D-Dimer, Quant: <0.27 ug/mL-FEU (ref 0.00–0.48)

## 2013-04-25 NOTE — ED Notes (Signed)
Sob w/ exertion and muscle fatique x 1 week  No cough or cp went to dr today and was sent here for further tests

## 2013-04-25 NOTE — ED Provider Notes (Signed)
CSN: [id number]     Arrival date & time 04/25/13  1005 History   First MD Initiated Contact with Patient 04/25/13 1036     Chief Complaint  Patient presents with  . Shortness of Breath    HPI  Patient presents with dyspnea, fatigue.  Symptoms began approximately one week ago, without clear precipitant.  Since onset symptoms have been persistent.  Patient notes that since onset he has had significantly decreased ability to perform physical activities of daily living.  He specifically denies pain, lightheadedness, syncope nausea and vomiting, anorexia, rash. He denies urinary or bowel habit changes. No new medication, diet, no travel.  Patient has a notable history I TP, but saw his oncologist years ago, was cleared of this entity.   Past Medical History  Diagnosis Date  . Depression   . Hypertension   . ITP (idiopathic thrombocytopenic purpura)    History reviewed. No pertinent past surgical history. No family history on file. History  Substance Use Topics  . Smoking status: Never Smoker   . Smokeless tobacco: Not on file  . Alcohol Use: No    Review of Systems  Constitutional:       Per HPI, otherwise negative  HENT:       Per HPI, otherwise negative  Respiratory: Positive for shortness of breath. Negative for cough and wheezing.   Cardiovascular: Negative for palpitations and leg swelling.  Gastrointestinal: Negative for nausea, vomiting, abdominal pain, diarrhea, blood in stool, abdominal distention and anal bleeding.  Endocrine:       Negative aside from HPI  Genitourinary:       Neg aside from HPI   Musculoskeletal:       Per HPI, otherwise negative  Skin: Negative.   Neurological: Negative for syncope.    Allergies  Review of patient's allergies indicates no known allergies.  Home Medications   Current Outpatient Rx  Name  Route  Sig  Dispense  Refill  . atenolol (TENORMIN) 25 MG tablet   Oral   Take 25 mg by mouth daily.         Marland Kitchen atorvastatin  (LIPITOR) 10 MG tablet   Oral   Take 10 mg by mouth daily.         Marland Kitchen lisinopril-hydrochlorothiazide (PRINZIDE,ZESTORETIC) 10-12.5 MG per tablet   Oral   Take 1 tablet by mouth daily.         Marland Kitchen PARoxetine (PAXIL) 20 MG tablet   Oral   Take 20 mg by mouth daily.          BP 125/74  Pulse 87  Temp(Src) 98.5 F (36.9 C) (Oral)  Resp 18  SpO2 100% Physical Exam  Nursing note and vitals reviewed. Constitutional: He is oriented to person, place, and time. He appears well-developed. No distress.  HENT:  Head: Normocephalic and atraumatic.  Eyes: Conjunctivae and EOM are normal.  Cardiovascular: Normal rate and regular rhythm.   Pulmonary/Chest: Effort normal. No stridor. No respiratory distress.  Abdominal: He exhibits no distension. There is no tenderness. There is no rebound.  Musculoskeletal: He exhibits no edema.  Neurological: He is alert and oriented to person, place, and time.  Skin: Skin is warm and dry. No pallor.  Psychiatric: He has a normal mood and affect.    ED Course  Procedures (including critical care time) Labs Review Labs Reviewed  COMPREHENSIVE METABOLIC PANEL - Abnormal; Notable for the following:    Sodium 133 (*)    Glucose, Bld 104 (*)  Total Bilirubin 1.5 (*)    GFR calc non Af Amer 69 (*)    GFR calc Af Amer 80 (*)    All other components within normal limits  CBC WITH DIFFERENTIAL - Abnormal; Notable for the following:    RBC 3.40 (*)    Hemoglobin 10.7 (*)    HCT 30.4 (*)    All other components within normal limits  D-DIMER, QUANTITATIVE  POCT I-STAT TROPONIN I   Imaging Review Dg Chest 2 View  04/25/2013   CLINICAL DATA:  Shortness of breath, hypertension  EXAM: CHEST  2 VIEW  COMPARISON:  None.  FINDINGS: Normal heart size and vascularity. No focal pneumonia, collapse or consolidation. No effusion or pneumothorax. Trachea midline.  IMPRESSION: No acute chest process   Electronically Signed   By: Ruel Favors M.D.   On: 04/25/2013  10:54    EKG Interpretation    Date/Time:  Monday April 25 2013 10:12:56 EST Ventricular Rate:  98 PR Interval:  154 QRS Duration: 70 QT Interval:  360 QTC Calculation: 459 R Axis:   70 Text Interpretation:  Normal sinus rhythm Normal ECG Sinus rhythm Artifact Normal ECG Confirmed by Gerhard Munch  MD ([id number]) on 04/25/2013 11:06:25 AM           On repeat exam the patient appears calm, comfortable.  I recommend recollect exam, urinalysis with the hyperbilirubinemia, and anemia.  The patient defers both of these comes that he'll followup with his primary care physician tomorrow. MDM   1. Dyspnea   2. Anemia    Patient presents with ongoing dyspnea, fatigue.  On exam he is awake alert and hemodynamically stable, in no distress.  Patient's evaluation doesn't demonstrate new anemia, mild hyperbilirubinemia.*The patient, including risks and benefits outpatient evaluation versus staying in the emergency department for additional evaluation the patient requested discharge, states that he will follow up with his physician tomorrow.  With no distress, the weeklong persistency of symptoms, this seems reasonable.  Patient discharged in stable condition.    Gerhard Munch, MD 04/25/13 [phone]

## 2013-04-25 NOTE — ED Notes (Signed)
Patient transported to X-ray 

## 2013-04-27 ENCOUNTER — Telehealth: Payer: Self-pay | Admitting: Oncology

## 2013-04-27 NOTE — Telephone Encounter (Signed)
REFERRAL RECEIVED GIVEN TO DESK NURSE.

## 2013-05-02 ENCOUNTER — Telehealth: Payer: Self-pay | Admitting: *Deleted

## 2013-05-02 NOTE — Telephone Encounter (Signed)
Reviewed with MD pt to be seen 12/2 at 11am with lab prior. Called pt lmovm requesting pt call back to confirm message rcvd. POF sent

## 2013-05-02 NOTE — Telephone Encounter (Signed)
Pt called states he was recently in ED 11/24,  dx with autoimmune hemolytic anemia, currently taking Prednisone 80mg  daily and would like to come in to be seen asap. Message given to provider for review. Pt last seen 12/08/12

## 2013-05-03 ENCOUNTER — Telehealth: Payer: Self-pay | Admitting: Oncology

## 2013-05-03 ENCOUNTER — Encounter: Payer: Self-pay | Admitting: Oncology

## 2013-05-03 ENCOUNTER — Other Ambulatory Visit: Payer: Self-pay | Admitting: Emergency Medicine

## 2013-05-03 ENCOUNTER — Other Ambulatory Visit: Payer: Self-pay | Admitting: *Deleted

## 2013-05-03 ENCOUNTER — Ambulatory Visit (HOSPITAL_BASED_OUTPATIENT_CLINIC_OR_DEPARTMENT_OTHER): Payer: 59 | Admitting: Lab

## 2013-05-03 ENCOUNTER — Ambulatory Visit (HOSPITAL_BASED_OUTPATIENT_CLINIC_OR_DEPARTMENT_OTHER): Payer: 59 | Admitting: Oncology

## 2013-05-03 VITALS — BP 125/73 | HR 80 | Temp 99.1°F | Resp 18 | Ht 67.0 in | Wt 203.7 lb

## 2013-05-03 DIAGNOSIS — D696 Thrombocytopenia, unspecified: Secondary | ICD-10-CM | POA: Insufficient documentation

## 2013-05-03 DIAGNOSIS — D473 Essential (hemorrhagic) thrombocythemia: Secondary | ICD-10-CM

## 2013-05-03 DIAGNOSIS — D599 Acquired hemolytic anemia, unspecified: Secondary | ICD-10-CM

## 2013-05-03 DIAGNOSIS — D589 Hereditary hemolytic anemia, unspecified: Secondary | ICD-10-CM

## 2013-05-03 DIAGNOSIS — D693 Immune thrombocytopenic purpura: Secondary | ICD-10-CM | POA: Insufficient documentation

## 2013-05-03 HISTORY — DX: Hereditary hemolytic anemia, unspecified: D58.9

## 2013-05-03 LAB — COMPREHENSIVE METABOLIC PANEL (CC13)
ALT: 23 U/L (ref 0–55)
Albumin: 3.6 g/dL (ref 3.5–5.0)
Alkaline Phosphatase: 78 U/L (ref 40–150)
BUN: 24.7 mg/dL (ref 7.0–26.0)
Creatinine: 1.3 mg/dL (ref 0.7–1.3)
Glucose: 105 mg/dl (ref 70–140)
Total Bilirubin: 1.06 mg/dL (ref 0.20–1.20)

## 2013-05-03 LAB — CBC & DIFF AND RETIC
BASO%: 0.1 % (ref 0.0–2.0)
HGB: 11.5 g/dL — ABNORMAL LOW (ref 13.0–17.1)
LYMPH%: 11.2 % — ABNORMAL LOW (ref 14.0–49.0)
MCHC: 33 g/dL (ref 32.0–36.0)
MONO#: 1.6 10*3/uL — ABNORMAL HIGH (ref 0.1–0.9)
NEUT#: 10 10*3/uL — ABNORMAL HIGH (ref 1.5–6.5)
Platelets: 344 10*3/uL (ref 140–400)
RBC: 3.68 10*6/uL — ABNORMAL LOW (ref 4.20–5.82)
Retic %: 12.34 % — ABNORMAL HIGH (ref 0.80–1.80)
WBC: 13 10*3/uL — ABNORMAL HIGH (ref 4.0–10.3)

## 2013-05-03 LAB — LACTATE DEHYDROGENASE (CC13): LDH: 278 U/L — ABNORMAL HIGH (ref 125–245)

## 2013-05-03 MED ORDER — PREDNISOLONE 5 MG PO TABS: ORAL_TABLET | ORAL | Status: DC

## 2013-05-03 NOTE — Progress Notes (Signed)
OFFICE PROGRESS NOTE  CCDarrow Bussing, MD [address] [place] [address]  DIAGNOSIS: 52 year old gentleman with history of ITP originally diagnosed in 2008. He said complete remission. Now with new diagnosis of hemolytic anemia.  PRIOR THERAPY:  #1 ITP: Patient was originally diagnosed in March 2008 with ITP. He was treated with IV Solu-Medrol oral prednisone and IVIG. He thereafter was tapered off of his prednisone continued to do well. He's been in complete remission from his ITP  2 recently seen by his PCP with increasing shortness of breath new diagnosis of immune hemolytic anemia he is currently on steroids at 80 mg daily  CURRENT THERAPY: Prednisone 80 mg daily was eventually taper  INTERVAL HISTORY: Kenneth Juarez 52 y.o. male returns for initial visit for his autoimmune hemolytic anemia. Patient tells me that about 3-4 weeks ago he started having some sensitivity in the right chest wall. This subsequently led to increasing weakness fatigue. He before Thanksgiving patient was found to be very weak tired short of breath. He was seen by his PCP. Patient had a CBC performed which showed a hemoglobin that was down to 10.7. LDH was elevated total bilirubin was 1.5 haptoglobin was low. All indicating an he has autoimmune hemolytic anemia. He was sent to the emergency room. He had EKG performed was normal. He subsequently was placed on steroids consisting of prednisone 90 mg daily patient is take 80 mg. His hemoglobin has now risen to 11.5. Clinically he seems to be feeling much better with diminished shortness of breath no weakness or fatigue. He is back at work.  MEDICAL HISTORY: Past Medical History  Diagnosis Date  . Depression   . Hypertension   . ITP (idiopathic thrombocytopenic purpura)     ALLERGIES:  has No Known Allergies.  MEDICATIONS:  Current Outpatient Prescriptions  Medication Sig Dispense Refill  . PREDNISOLONE PO Take 80 mg by mouth.       Marland Kitchen atenolol (TENORMIN) 25 MG tablet Take 25 mg by mouth daily.      Marland Kitchen atorvastatin (LIPITOR) 10 MG tablet Take 10 mg by mouth daily.      Marland Kitchen lisinopril-hydrochlorothiazide (PRINZIDE,ZESTORETIC) 10-12.5 MG per tablet Take 1 tablet by mouth daily.      Marland Kitchen PARoxetine (PAXIL) 20 MG tablet Take 20 mg by mouth daily.       No current facility-administered medications for this visit.    SURGICAL HISTORY: History reviewed. No pertinent past surgical history.  REVIEW OF SYSTEMS:  Pertinent items are noted in HPI.   HEALTH MAINTENANCE:   PHYSICAL EXAMINATION: Blood pressure 125/73, pulse 80, temperature 99.1 F (37.3 C), temperature source Oral, resp. rate 18, height 5\' 7"  (1.702 m), weight 203 lb 11.2 oz (92.398 kg). Body mass index is 31.9 kg/(m^2). ECOG PERFORMANCE STATUS: 0 - Asymptomatic   General appearance: alert, cooperative and appears stated age Neck: no adenopathy, no carotid bruit, no JVD, supple, symmetrical, trachea midline and thyroid not enlarged, symmetric, no tenderness/mass/nodules Lymph nodes: Cervical, supraclavicular, and axillary nodes normal. Resp: clear to auscultation bilaterally Cardio: regular rate and rhythm GI: soft, non-tender; bowel sounds normal; no masses,  no organomegaly Extremities: extremities normal, atraumatic, no cyanosis or edema Neurologic: Grossly normal   LABORATORY DATA: Lab Results  Component Value Date   WBC 13.0* 05/03/2013   HGB 11.5* 05/03/2013   HCT 34.9* 05/03/2013   MCV 94.8 05/03/2013   PLT 344 05/03/2013      Chemistry      Component Value  Date/Time   NA 137 05/03/2013 1051   NA 133* 04/25/2013 1100   NA 141 01/08/2010 1022   K 3.7 05/03/2013 1051   K 4.1 04/25/2013 1100   K 4.0 01/08/2010 1022   CL 99 04/25/2013 1100   CL 103 01/08/2010 1022   CO2 20* 05/03/2013 1051   CO2 22 04/25/2013 1100   CO2 23 01/08/2010 1022   BUN 24.7 05/03/2013 1051   BUN 17 04/25/2013 1100   BUN 15 01/08/2010 1022   CREATININE 1.3 05/03/2013 1051    CREATININE 1.19 04/25/2013 1100   CREATININE 1.2 01/08/2010 1022      Component Value Date/Time   CALCIUM 9.2 05/03/2013 1051   CALCIUM 9.1 04/25/2013 1100   CALCIUM 9.5 01/08/2010 1022   ALKPHOS 78 05/03/2013 1051   ALKPHOS 79 04/25/2013 1100   ALKPHOS 71 01/08/2010 1022   AST 13 05/03/2013 1051   AST 27 04/25/2013 1100   AST 26 01/08/2010 1022   ALT 23 05/03/2013 1051   ALT 30 04/25/2013 1100   ALT 32 01/08/2010 1022   BILITOT 1.06 05/03/2013 1051   BILITOT 1.5* 04/25/2013 1100   BILITOT 0.60 01/08/2010 1022       RADIOGRAPHIC STUDIES:  Dg Chest 2 View  04/25/2013   CLINICAL DATA:  Shortness of breath, hypertension  EXAM: CHEST  2 VIEW  COMPARISON:  None.  FINDINGS: Normal heart size and vascularity. No focal pneumonia, collapse or consolidation. No effusion or pneumothorax. Trachea midline.  IMPRESSION: No acute chest process   Electronically Signed   By: Ruel Favors M.D.   On: 04/25/2013 10:54    ASSESSMENT: 52 year old gentleman with  #1 history of ITP now in remission.  #2 iron in hemolytic anemia: Patient is on prednisone. I checked a CBC, LDH, haptoglobin, Coombs direct count as well as LFTs. We'll followup on this.  #3 I will check ultrasound of the spleen splenomegaly  #3 continue prednisone   PLAN:   #1 workup for hemolytic anemia with abscess above and ultrasound the spleen  #2 he will continue to be on prednisone I have recommended a taper as follows Prednisone 80 mg daily x1 week, his dose to 60 mg daily x1 week, 40 mg daily x1 week, then 20 mg daily x1 week. We will check a CBC on him on a weekly basis to determine further reduction of doses.  #3 I will see the patient back in early January 2015 or sooner if need arises   All questions were answered. The patient knows to call the clinic with any problems, questions or concerns. We can certainly see the patient much sooner if necessary.  I spent 30 minutes counseling the patient face to face. The total time spent  in the appointment was 30 minutes.    Drue Second, MD Medical/Oncology [facility] [phone] (beeper) [phone] (Office)  05/03/2013, 11:42 AM

## 2013-05-04 ENCOUNTER — Encounter: Payer: Self-pay | Admitting: Oncology

## 2013-05-04 LAB — DIRECT ANTIGLOBULIN TEST (NOT AT ARMC): DAT IgG: POSITIVE — AB

## 2013-05-04 LAB — HAPTOGLOBIN: Haptoglobin: 108 mg/dL (ref 45–215)

## 2013-05-04 NOTE — Progress Notes (Signed)
Put fmla form on nurse's desk °

## 2013-05-10 ENCOUNTER — Other Ambulatory Visit (HOSPITAL_BASED_OUTPATIENT_CLINIC_OR_DEPARTMENT_OTHER): Payer: 59

## 2013-05-10 ENCOUNTER — Encounter: Payer: Self-pay | Admitting: Oncology

## 2013-05-10 DIAGNOSIS — D693 Immune thrombocytopenic purpura: Secondary | ICD-10-CM

## 2013-05-10 LAB — CBC WITH DIFFERENTIAL/PLATELET
BASO%: 0.5 % (ref 0.0–2.0)
EOS%: 0.2 % (ref 0.0–7.0)
HGB: 12.9 g/dL — ABNORMAL LOW (ref 13.0–17.1)
LYMPH%: 16.2 % (ref 14.0–49.0)
MCH: 31.5 pg (ref 27.2–33.4)
MCHC: 32.3 g/dL (ref 32.0–36.0)
MCV: 97.4 fL (ref 79.3–98.0)
MONO%: 8.9 % (ref 0.0–14.0)
Platelets: 295 10*3/uL (ref 140–400)
RBC: 4.09 10*6/uL — ABNORMAL LOW (ref 4.20–5.82)
WBC: 11.6 10*3/uL — ABNORMAL HIGH (ref 4.0–10.3)

## 2013-05-10 NOTE — Progress Notes (Signed)
Faxed fmla form to [employer] @ [phone] °

## 2013-05-12 ENCOUNTER — Telehealth: Payer: Self-pay | Admitting: Emergency Medicine

## 2013-05-12 NOTE — Telephone Encounter (Signed)
Left message on patient's voicemail, instructed patient to continue the Prednisone 60mg  daily this week and 40mg s daily next week with labs on 12/16 as scheduled. Instructed patient to call for any further questions or concerns.

## 2013-05-17 ENCOUNTER — Other Ambulatory Visit (HOSPITAL_BASED_OUTPATIENT_CLINIC_OR_DEPARTMENT_OTHER): Payer: 59

## 2013-05-17 DIAGNOSIS — D693 Immune thrombocytopenic purpura: Secondary | ICD-10-CM

## 2013-05-17 LAB — CBC WITH DIFFERENTIAL/PLATELET
BASO%: 0.2 % (ref 0.0–2.0)
LYMPH%: 27.1 % (ref 14.0–49.0)
MCHC: 32 g/dL (ref 32.0–36.0)
MONO#: 0.8 10*3/uL (ref 0.1–0.9)
RBC: 4.19 10*6/uL — ABNORMAL LOW (ref 4.20–5.82)
RDW: 17.4 % — ABNORMAL HIGH (ref 11.0–14.6)
WBC: 9.1 10*3/uL (ref 4.0–10.3)
lymph#: 2.5 10*3/uL (ref 0.9–3.3)

## 2013-05-19 ENCOUNTER — Encounter: Payer: Self-pay | Admitting: Oncology

## 2013-05-24 ENCOUNTER — Other Ambulatory Visit (HOSPITAL_BASED_OUTPATIENT_CLINIC_OR_DEPARTMENT_OTHER): Payer: 59

## 2013-05-24 DIAGNOSIS — D693 Immune thrombocytopenic purpura: Secondary | ICD-10-CM

## 2013-05-24 LAB — CBC WITH DIFFERENTIAL/PLATELET
BASO%: 0.8 % (ref 0.0–2.0)
Basophils Absolute: 0.1 10*3/uL (ref 0.0–0.1)
HCT: 39.4 % (ref 38.4–49.9)
HGB: 13.2 g/dL (ref 13.0–17.1)
MCHC: 33.5 g/dL (ref 32.0–36.0)
MCV: 97.7 fL (ref 79.3–98.0)
MONO#: 0.6 10*3/uL (ref 0.1–0.9)
MONO%: 6.4 % (ref 0.0–14.0)
NEUT#: 7.3 10*3/uL — ABNORMAL HIGH (ref 1.5–6.5)
NEUT%: 75.2 % — ABNORMAL HIGH (ref 39.0–75.0)
RDW: 16.3 % — ABNORMAL HIGH (ref 11.0–14.6)
WBC: 9.7 10*3/uL (ref 4.0–10.3)
lymph#: 1.3 10*3/uL (ref 0.9–3.3)

## 2013-05-25 ENCOUNTER — Encounter: Payer: Self-pay | Admitting: Oncology

## 2013-05-25 ENCOUNTER — Other Ambulatory Visit: Payer: Self-pay | Admitting: *Deleted

## 2013-05-31 ENCOUNTER — Other Ambulatory Visit (HOSPITAL_BASED_OUTPATIENT_CLINIC_OR_DEPARTMENT_OTHER): Payer: 59

## 2013-05-31 DIAGNOSIS — D693 Immune thrombocytopenic purpura: Secondary | ICD-10-CM

## 2013-05-31 LAB — CBC WITH DIFFERENTIAL/PLATELET
Basophils Absolute: 0.1 10*3/uL (ref 0.0–0.1)
EOS%: 2.4 % (ref 0.0–7.0)
Eosinophils Absolute: 0.3 10*3/uL (ref 0.0–0.5)
HCT: 38.4 % (ref 38.4–49.9)
HGB: 12.9 g/dL — ABNORMAL LOW (ref 13.0–17.1)
MCH: 32.2 pg (ref 27.2–33.4)
MCHC: 33.6 g/dL (ref 32.0–36.0)
MONO#: 0.9 10*3/uL (ref 0.1–0.9)
NEUT#: 8.1 10*3/uL — ABNORMAL HIGH (ref 1.5–6.5)
NEUT%: 76.7 % — ABNORMAL HIGH (ref 39.0–75.0)
WBC: 10.5 10*3/uL — ABNORMAL HIGH (ref 4.0–10.3)
lymph#: 1.2 10*3/uL (ref 0.9–3.3)

## 2013-06-07 ENCOUNTER — Other Ambulatory Visit (HOSPITAL_BASED_OUTPATIENT_CLINIC_OR_DEPARTMENT_OTHER): Payer: 59

## 2013-06-07 ENCOUNTER — Telehealth: Payer: Self-pay | Admitting: Oncology

## 2013-06-07 ENCOUNTER — Ambulatory Visit (HOSPITAL_BASED_OUTPATIENT_CLINIC_OR_DEPARTMENT_OTHER): Payer: 59 | Admitting: Oncology

## 2013-06-07 VITALS — BP 108/73 | HR 81 | Temp 99.4°F | Resp 18 | Ht 67.0 in | Wt 202.0 lb

## 2013-06-07 DIAGNOSIS — D589 Hereditary hemolytic anemia, unspecified: Secondary | ICD-10-CM

## 2013-06-07 DIAGNOSIS — D591 Autoimmune hemolytic anemia, unspecified: Secondary | ICD-10-CM

## 2013-06-07 DIAGNOSIS — D473 Essential (hemorrhagic) thrombocythemia: Secondary | ICD-10-CM

## 2013-06-07 DIAGNOSIS — D693 Immune thrombocytopenic purpura: Secondary | ICD-10-CM

## 2013-06-07 LAB — CBC WITH DIFFERENTIAL/PLATELET
BASO%: 1 % (ref 0.0–2.0)
Basophils Absolute: 0.1 10*3/uL (ref 0.0–0.1)
EOS%: 1.1 % (ref 0.0–7.0)
Eosinophils Absolute: 0.1 10*3/uL (ref 0.0–0.5)
HCT: 39.2 % (ref 38.4–49.9)
HGB: 13.3 g/dL (ref 13.0–17.1)
LYMPH#: 1.7 10*3/uL (ref 0.9–3.3)
LYMPH%: 16 % (ref 14.0–49.0)
MCH: 32.5 pg (ref 27.2–33.4)
MCHC: 33.9 g/dL (ref 32.0–36.0)
MCV: 95.7 fL (ref 79.3–98.0)
MONO#: 1.1 10*3/uL — AB (ref 0.1–0.9)
MONO%: 10.1 % (ref 0.0–14.0)
NEUT#: 7.6 10*3/uL — ABNORMAL HIGH (ref 1.5–6.5)
NEUT%: 71.8 % (ref 39.0–75.0)
Platelets: 369 10*3/uL (ref 140–400)
RBC: 4.09 10*6/uL — AB (ref 4.20–5.82)
RDW: 15.1 % — ABNORMAL HIGH (ref 11.0–14.6)
WBC: 10.6 10*3/uL — AB (ref 4.0–10.3)

## 2013-06-07 MED ORDER — PREDNISONE 5 MG PO TABS: 5.0000 mg | ORAL_TABLET | Freq: Every day | ORAL | Status: DC

## 2013-06-07 NOTE — Telephone Encounter (Signed)
, °

## 2013-06-09 NOTE — Progress Notes (Signed)
OFFICE PROGRESS NOTE  CCLujean Amel, MD [address] [place] [address]  DIAGNOSIS: 53 year old gentleman with history of ITP originally diagnosed in 2008. 2014 autoimmune hemolytic anemia  PRIOR THERAPY:  #1 ITP: Patient was originally diagnosed in March 2008 with ITP. He was treated with IV Solu-Medrol oral prednisone and IVIG. He thereafter was tapered off of his prednisone continued to do well. He's been in complete remission from his ITP  #2 autoimmune hemolytic anemia: On prednisone taper  CURRENT THERAPY: Prednisone 5 mg daily x2 weeks then reduce dose to 2.5 mg daily  INTERVAL HISTORY: Kenneth Juarez 53 y.o. male returns for followup today. He is now actively being tapered from his prednisone. His CBC hemoglobin and hematocrit and hemolysis panel have stabilized. He is successfully been tapered from the steroids. Overall patient himself feels well. He has no fatigue no shortness of breath chest pains or palpitations. All of his prior symptoms have resolved.   MEDICAL HISTORY: Past Medical History  Diagnosis Date  . Depression   . Hypertension   . ITP (idiopathic thrombocytopenic purpura)   . Hemolytic anemia 05/03/2013    ALLERGIES:  has No Known Allergies.  MEDICATIONS:  Current Outpatient Prescriptions  Medication Sig Dispense Refill  . atenolol (TENORMIN) 25 MG tablet Take 25 mg by mouth daily.      Marland Kitchen atorvastatin (LIPITOR) 10 MG tablet Take 10 mg by mouth daily.      Marland Kitchen lisinopril-hydrochlorothiazide (PRINZIDE,ZESTORETIC) 10-12.5 MG per tablet Take 1 tablet by mouth daily.      Marland Kitchen PARoxetine (PAXIL) 20 MG tablet Take 20 mg by mouth daily.      . predniSONE (DELTASONE) 5 MG tablet Take 1 tablet (5 mg total) by mouth daily with breakfast.  30 tablet  3   No current facility-administered medications for this visit.    SURGICAL HISTORY: No past surgical history on file.  REVIEW OF SYSTEMS:  Pertinent items are noted in HPI.    HEALTH MAINTENANCE:   PHYSICAL EXAMINATION: Blood pressure 108/73, pulse 81, temperature 99.4 F (37.4 C), temperature source Oral, resp. rate 18, height 5\' 7"  (1.702 m), weight 202 lb (91.627 kg). Body mass index is 31.63 kg/(m^2). ECOG PERFORMANCE STATUS: 0 - Asymptomatic   General appearance: alert, cooperative and appears stated age Neck: no adenopathy, no carotid bruit, no JVD, supple, symmetrical, trachea midline and thyroid not enlarged, symmetric, no tenderness/mass/nodules Lymph nodes: Cervical, supraclavicular, and axillary nodes normal. Resp: clear to auscultation bilaterally Cardio: regular rate and rhythm GI: soft, non-tender; bowel sounds normal; no masses,  no organomegaly Extremities: extremities normal, atraumatic, no cyanosis or edema Neurologic: Grossly normal   LABORATORY DATA: Lab Results  Component Value Date   WBC 10.6* 06/07/2013   HGB 13.3 06/07/2013   HCT 39.2 06/07/2013   MCV 95.7 06/07/2013   PLT 369 06/07/2013      Chemistry      Component Value Date/Time   NA 137 05/03/2013 1051   NA 133* 04/25/2013 1100   NA 141 01/08/2010 1022   K 3.7 05/03/2013 1051   K 4.1 04/25/2013 1100   K 4.0 01/08/2010 1022   CL 99 04/25/2013 1100   CL 103 01/08/2010 1022   CO2 20* 05/03/2013 1051   CO2 22 04/25/2013 1100   CO2 23 01/08/2010 1022   BUN 24.7 05/03/2013 1051   BUN 17 04/25/2013 1100   BUN 15 01/08/2010 1022   CREATININE 1.3 05/03/2013 1051   CREATININE 1.19 04/25/2013 1100  CREATININE 1.2 01/08/2010 1022      Component Value Date/Time   CALCIUM 9.2 05/03/2013 1051   CALCIUM 9.1 04/25/2013 1100   CALCIUM 9.5 01/08/2010 1022   ALKPHOS 78 05/03/2013 1051   ALKPHOS 79 04/25/2013 1100   ALKPHOS 71 01/08/2010 1022   AST 13 05/03/2013 1051   AST 27 04/25/2013 1100   AST 26 01/08/2010 1022   ALT 23 05/03/2013 1051   ALT 30 04/25/2013 1100   ALT 32 01/08/2010 1022   BILITOT 1.06 05/03/2013 1051   BILITOT 1.5* 04/25/2013 1100   BILITOT 0.60 01/08/2010 1022       RADIOGRAPHIC  STUDIES:  Dg Chest 2 View  04/25/2013   CLINICAL DATA:  Shortness of breath, hypertension  EXAM: CHEST  2 VIEW  COMPARISON:  None.  FINDINGS: Normal heart size and vascularity. No focal pneumonia, collapse or consolidation. No effusion or pneumothorax. Trachea midline.  IMPRESSION: No acute chest process   Electronically Signed   By: Daryll Brod M.D.   On: 04/25/2013 10:54    ASSESSMENT: 53 year old gentleman with  #1 history of ITP now in remission.  #2 I am you and hemolytic anemia: Patient is on prednisone. I checked a CBC, LDH, haptoglobin, Coombs direct count as well as LFTs.   #3 I will check ultrasound of the spleen splenomegaly  #3 continue prednisone taper as noted above   PLAN:   #1 continue prednisone taper  #2 patient will be seen back in 2 months time for followup.  All questions were answered. The patient knows to call the clinic with any problems, questions or concerns. We can certainly see the patient much sooner if necessary.  I spent 15 minutes counseling the patient face to face. The total time spent in the appointment was 30 minutes.    Marcy Panning, MD Medical/Oncology [facility] [phone] (beeper) [phone] (Office)  06/09/2013, 5:36 PM

## 2013-06-14 ENCOUNTER — Other Ambulatory Visit: Payer: 59

## 2013-06-21 ENCOUNTER — Other Ambulatory Visit (HOSPITAL_BASED_OUTPATIENT_CLINIC_OR_DEPARTMENT_OTHER): Payer: 59

## 2013-06-21 ENCOUNTER — Other Ambulatory Visit: Payer: 59

## 2013-06-21 DIAGNOSIS — D693 Immune thrombocytopenic purpura: Secondary | ICD-10-CM

## 2013-06-21 LAB — CBC WITH DIFFERENTIAL/PLATELET
BASO%: 1.1 % (ref 0.0–2.0)
BASOS ABS: 0.1 10*3/uL (ref 0.0–0.1)
EOS%: 2.2 % (ref 0.0–7.0)
Eosinophils Absolute: 0.2 10*3/uL (ref 0.0–0.5)
HEMATOCRIT: 37.9 % — AB (ref 38.4–49.9)
HEMOGLOBIN: 12.9 g/dL — AB (ref 13.0–17.1)
LYMPH#: 3 10*3/uL (ref 0.9–3.3)
LYMPH%: 38.1 % (ref 14.0–49.0)
MCH: 31.7 pg (ref 27.2–33.4)
MCHC: 34.1 g/dL (ref 32.0–36.0)
MCV: 93 fL (ref 79.3–98.0)
MONO#: 1 10*3/uL — ABNORMAL HIGH (ref 0.1–0.9)
MONO%: 12.5 % (ref 0.0–14.0)
NEUT#: 3.7 10*3/uL (ref 1.5–6.5)
NEUT%: 46.1 % (ref 39.0–75.0)
Platelets: 362 10*3/uL (ref 140–400)
RBC: 4.07 10*6/uL — ABNORMAL LOW (ref 4.20–5.82)
RDW: 15 % — ABNORMAL HIGH (ref 11.0–14.6)
WBC: 7.9 10*3/uL (ref 4.0–10.3)

## 2013-06-22 ENCOUNTER — Telehealth: Payer: Self-pay | Admitting: Emergency Medicine

## 2013-06-22 ENCOUNTER — Encounter: Payer: Self-pay | Admitting: Oncology

## 2013-06-22 NOTE — Telephone Encounter (Signed)
Left voicemail for patient with lab results and instructions to alternate Prednisone 5mg  and 10mg  and follow up with Dr Humphrey Rolls on 2/3 as scheduled.

## 2013-06-28 ENCOUNTER — Other Ambulatory Visit: Payer: 59

## 2013-07-05 ENCOUNTER — Telehealth: Payer: Self-pay | Admitting: Oncology

## 2013-07-05 ENCOUNTER — Other Ambulatory Visit (HOSPITAL_BASED_OUTPATIENT_CLINIC_OR_DEPARTMENT_OTHER): Payer: 59

## 2013-07-05 ENCOUNTER — Ambulatory Visit (HOSPITAL_BASED_OUTPATIENT_CLINIC_OR_DEPARTMENT_OTHER): Payer: 59 | Admitting: Oncology

## 2013-07-05 ENCOUNTER — Encounter: Payer: Self-pay | Admitting: Oncology

## 2013-07-05 ENCOUNTER — Other Ambulatory Visit: Payer: 59

## 2013-07-05 VITALS — BP 143/87 | HR 99 | Temp 99.0°F | Resp 18 | Wt 204.6 lb

## 2013-07-05 DIAGNOSIS — D599 Acquired hemolytic anemia, unspecified: Secondary | ICD-10-CM

## 2013-07-05 DIAGNOSIS — D693 Immune thrombocytopenic purpura: Secondary | ICD-10-CM

## 2013-07-05 DIAGNOSIS — D589 Hereditary hemolytic anemia, unspecified: Secondary | ICD-10-CM

## 2013-07-05 LAB — CBC WITH DIFFERENTIAL/PLATELET
BASO%: 2.1 % — ABNORMAL HIGH (ref 0.0–2.0)
BASOS ABS: 0.2 10*3/uL — AB (ref 0.0–0.1)
EOS%: 2.7 % (ref 0.0–7.0)
Eosinophils Absolute: 0.2 10*3/uL (ref 0.0–0.5)
HEMATOCRIT: 41.4 % (ref 38.4–49.9)
HEMOGLOBIN: 13.8 g/dL (ref 13.0–17.1)
LYMPH#: 2.6 10*3/uL (ref 0.9–3.3)
LYMPH%: 31.1 % (ref 14.0–49.0)
MCH: 30.9 pg (ref 27.2–33.4)
MCHC: 33.4 g/dL (ref 32.0–36.0)
MCV: 92.4 fL (ref 79.3–98.0)
MONO#: 0.6 10*3/uL (ref 0.1–0.9)
MONO%: 7.8 % (ref 0.0–14.0)
NEUT#: 4.6 10*3/uL (ref 1.5–6.5)
NEUT%: 56.3 % (ref 39.0–75.0)
Platelets: 331 10*3/uL (ref 140–400)
RBC: 4.48 10*6/uL (ref 4.20–5.82)
RDW: 14 % (ref 11.0–14.6)
WBC: 8.2 10*3/uL (ref 4.0–10.3)

## 2013-07-05 NOTE — Progress Notes (Signed)
OFFICE PROGRESS NOTE  CCLujean Amel, MD [address] [place] [address]  DIAGNOSIS: 53 year old gentleman with history of ITP originally diagnosed in 2008. 2014 autoimmune hemolytic anemia  PRIOR THERAPY:  #1 ITP: Patient was originally diagnosed in March 2008 with ITP. He was treated with IV Solu-Medrol oral prednisone and IVIG. He thereafter was tapered off of his prednisone continued to do well. He's been in complete remission from his ITP  #2 autoimmune hemolytic anemia: On prednisone taper  CURRENT THERAPY: Prednisone 5 mg daily INTERVAL HISTORY: Kenneth Juarez 53 y.o. male returns for followup today. He is now actively being tapered from his prednisone. His CBC hemoglobin and hematocrit and hemolysis panel have stabilized. He is successfully been tapered from the steroids. Overall patient himself feels well. He has no fatigue no shortness of breath chest pains or palpitations. All of his prior symptoms have resolved.   MEDICAL HISTORY: Past Medical History  Diagnosis Date  . Depression   . Hypertension   . ITP (idiopathic thrombocytopenic purpura)   . Hemolytic anemia 05/03/2013    ALLERGIES:  has No Known Allergies.  MEDICATIONS:  Current Outpatient Prescriptions  Medication Sig Dispense Refill  . atenolol (TENORMIN) 25 MG tablet Take 25 mg by mouth daily.      Marland Kitchen atorvastatin (LIPITOR) 10 MG tablet Take 10 mg by mouth daily.      Marland Kitchen lisinopril-hydrochlorothiazide (PRINZIDE,ZESTORETIC) 10-12.5 MG per tablet Take 1 tablet by mouth daily.      Marland Kitchen PARoxetine (PAXIL) 20 MG tablet Take 20 mg by mouth daily.      . predniSONE (DELTASONE) 5 MG tablet Take 1 tablet (5 mg total) by mouth daily with breakfast.  30 tablet  3   No current facility-administered medications for this visit.    SURGICAL HISTORY: No past surgical history on file.  REVIEW OF SYSTEMS:  Pertinent items are noted in HPI.   HEALTH MAINTENANCE:   PHYSICAL  EXAMINATION: Blood pressure 143/87, pulse 99, temperature 99 F (37.2 C), temperature source Oral, resp. rate 18, weight 204 lb 9.6 oz (92.806 kg). Body mass index is 32.04 kg/(m^2). ECOG PERFORMANCE STATUS: 0 - Asymptomatic   General appearance: alert, cooperative and appears stated age Neck: no adenopathy, no carotid bruit, no JVD, supple, symmetrical, trachea midline and thyroid not enlarged, symmetric, no tenderness/mass/nodules Lymph nodes: Cervical, supraclavicular, and axillary nodes normal. Resp: clear to auscultation bilaterally Cardio: regular rate and rhythm GI: soft, non-tender; bowel sounds normal; no masses,  no organomegaly Extremities: extremities normal, atraumatic, no cyanosis or edema Neurologic: Grossly normal   LABORATORY DATA: Lab Results  Component Value Date   WBC 8.2 07/05/2013   HGB 13.8 07/05/2013   HCT 41.4 07/05/2013   MCV 92.4 07/05/2013   PLT 331 07/05/2013      Chemistry      Component Value Date/Time   NA 137 05/03/2013 1051   NA 133* 04/25/2013 1100   NA 141 01/08/2010 1022   K 3.7 05/03/2013 1051   K 4.1 04/25/2013 1100   K 4.0 01/08/2010 1022   CL 99 04/25/2013 1100   CL 103 01/08/2010 1022   CO2 20* 05/03/2013 1051   CO2 22 04/25/2013 1100   CO2 23 01/08/2010 1022   BUN 24.7 05/03/2013 1051   BUN 17 04/25/2013 1100   BUN 15 01/08/2010 1022   CREATININE 1.3 05/03/2013 1051   CREATININE 1.19 04/25/2013 1100   CREATININE 1.2 01/08/2010 1022      Component Value  Date/Time   CALCIUM 9.2 05/03/2013 1051   CALCIUM 9.1 04/25/2013 1100   CALCIUM 9.5 01/08/2010 1022   ALKPHOS 78 05/03/2013 1051   ALKPHOS 79 04/25/2013 1100   ALKPHOS 71 01/08/2010 1022   AST 13 05/03/2013 1051   AST 27 04/25/2013 1100   AST 26 01/08/2010 1022   ALT 23 05/03/2013 1051   ALT 30 04/25/2013 1100   ALT 32 01/08/2010 1022   BILITOT 1.06 05/03/2013 1051   BILITOT 1.5* 04/25/2013 1100   BILITOT 0.60 01/08/2010 1022       RADIOGRAPHIC STUDIES:  Dg Chest 2 View  04/25/2013   CLINICAL DATA:   Shortness of breath, hypertension  EXAM: CHEST  2 VIEW  COMPARISON:  None.  FINDINGS: Normal heart size and vascularity. No focal pneumonia, collapse or consolidation. No effusion or pneumothorax. Trachea midline.  IMPRESSION: No acute chest process   Electronically Signed   By: Daryll Brod M.D.   On: 04/25/2013 10:54    ASSESSMENT/PLAN: 53 year old gentleman with  #1History of ITP in remission  #2Hemolytic anemia: Patient is on prednisone. He is currently on 5 mg alternating with 10. His hemoglobin has now normalized. I will have him take 5 mg daily. We will check CBC every 2 weeks and make adjustments in his dosage depending on the results.  #3 patient will be seen back in 3 months time for follow   All questions were answered. The patient knows to call the clinic with any problems, questions or concerns. We can certainly see the patient much sooner if necessary.  I spent 10 minutes counseling the patient face to face. The total time spent in the appointment was 15 minutes.    Marcy Panning, MD Medical/Oncology [facility] [phone] (beeper) [phone] (Office)  07/05/2013, 9:26 AM

## 2013-07-05 NOTE — Patient Instructions (Signed)
Hemolytic Anemia °Anemia is a condition in which you do not have enough red blood cells to carry oxygen throughout your body. Hemolytic anemia occurs when your red blood cells are being destroyed faster than they are being produced. Hemolytic anemia can affect people of all ages. It may worsen existing heart or lung disease. There are many types of hemolytic anemia, and they can be divided into two different groups: inherited and acquired. °Inherited hemolytic anemia is due to a gene that your parents passed on to you. The abnormal cells may break down while moving through your circulatory system. Your spleen may remove the abnormal blood cells and debris from your blood stream. Acquired hemolytic anemia occurs when your red blood cells are destroyed either by certain medicines that you have used or as a result of infections or diseases that you have. °CAUSES  °Hemolytic anemia is caused by red blood cell destruction. Sometimes the reasons for the destruction are not clear. Known causes include: °· Inherited disorders, such as sickle cell anemia and thalassemias. °· Use of certain medicines. °· Blood infection (septicemia). °· Exposure to toxic chemicals or excessive radiation. °· Reactions to blood transfusions. °· Certain immune disorders. °· Artificial heart valves. °· Enlarged spleens. °SYMPTOMS  °· Pale skin, eyes, and fingernails. °· Irregular or fast heartbeat. °· Headaches. °· Tiredness (fatigue) and weakness. °· Dizziness or fainting. °· Shortness of breath. °· Yellowing of the skin or eyes (jaundice). °· Chest pain. °· Cold hands and feet. °DIAGNOSIS  °Your health care provider will do a physical exam and ask questions about your symptoms. Blood tests, urine tests, and taking bone marrow tissue (biopsies) may be done to help find the cause of your anemia.  °TREATMENT °Treatment depends on the cause of your anemia. Treatment may include: °· Medicines. °· Blood transfusions. °· Plasmapheresis. °· Blood and  bone marrow stem cell transplant. °· Surgery to remove the spleen. °HOME CARE INSTRUCTIONS  °· Only take over-the-counter or prescription medicines as directed by your health care provider. If you are given antibiotics, take them as directed. Finish them even if you start to feel better. °· Take over-the-counter iron supplements as directed by your health care provider. °· Decrease the chances of getting sick by: °· Washing your hands often. °· Staying away from people who are sick. °· Getting a flu shot and pneumonia shot if recommended by your health care provider. °· Avoid certain kinds of foods that can expose you to bacteria, such as uncooked foods. °· Keep all follow-up appointments with your health care provider. °SEEK MEDICAL CARE IF:  °· You become dizzy or tired easily. °· Your skin looks pale. °· You feel your heart beating faster than normal. °· You feel like your heart has skipped or stopped beats (irregular heartbeat). °SEEK IMMEDIATE MEDICAL CARE IF:  °· Your skin and eyes turn yellow. °· You develop chest pain. °· You become short of breath. °· You faint. °· You develop an uncontrolled cough. °MAKE SURE YOU:  °· Understand these instructions. °· Will watch your condition. °· Will get help right away if you are not doing well or get worse. °Document Released: 05/19/2005 Document Revised: 01/19/2013 Document Reviewed: 10/06/2012 °[employer]® Patient Information ©2014 [employer]. ° °

## 2013-07-05 NOTE — Telephone Encounter (Signed)
, °

## 2013-07-12 ENCOUNTER — Other Ambulatory Visit: Payer: 59

## 2013-07-18 ENCOUNTER — Ambulatory Visit (HOSPITAL_BASED_OUTPATIENT_CLINIC_OR_DEPARTMENT_OTHER): Payer: 59

## 2013-07-18 ENCOUNTER — Telehealth: Payer: Self-pay | Admitting: Oncology

## 2013-07-18 DIAGNOSIS — D693 Immune thrombocytopenic purpura: Secondary | ICD-10-CM

## 2013-07-18 LAB — CBC WITH DIFFERENTIAL/PLATELET
BASO%: 2 % (ref 0.0–2.0)
BASOS ABS: 0.2 10*3/uL — AB (ref 0.0–0.1)
EOS%: 2.9 % (ref 0.0–7.0)
Eosinophils Absolute: 0.3 10*3/uL (ref 0.0–0.5)
HEMATOCRIT: 42.8 % (ref 38.4–49.9)
HEMOGLOBIN: 14.2 g/dL (ref 13.0–17.1)
LYMPH%: 21.7 % (ref 14.0–49.0)
MCH: 30.3 pg (ref 27.2–33.4)
MCHC: 33.2 g/dL (ref 32.0–36.0)
MCV: 91.2 fL (ref 79.3–98.0)
MONO#: 0.8 10*3/uL (ref 0.1–0.9)
MONO%: 9 % (ref 0.0–14.0)
NEUT#: 6 10*3/uL (ref 1.5–6.5)
NEUT%: 64.4 % (ref 39.0–75.0)
PLATELETS: 346 10*3/uL (ref 140–400)
RBC: 4.7 10*6/uL (ref 4.20–5.82)
RDW: 13.7 % (ref 11.0–14.6)
WBC: 9.3 10*3/uL (ref 4.0–10.3)
lymph#: 2 10*3/uL (ref 0.9–3.3)

## 2013-07-18 NOTE — Telephone Encounter (Signed)
, °

## 2013-07-18 NOTE — Progress Notes (Signed)
Quick Note:  Please call patient: hemoglobin/hematocrit look great ______

## 2013-07-19 ENCOUNTER — Other Ambulatory Visit: Payer: 59

## 2013-07-21 ENCOUNTER — Telehealth: Payer: Self-pay | Admitting: *Deleted

## 2013-07-21 NOTE — Telephone Encounter (Signed)
Returned call, left message. 

## 2013-07-26 ENCOUNTER — Other Ambulatory Visit: Payer: 59

## 2013-08-02 ENCOUNTER — Other Ambulatory Visit (HOSPITAL_BASED_OUTPATIENT_CLINIC_OR_DEPARTMENT_OTHER): Payer: 59

## 2013-08-02 DIAGNOSIS — D693 Immune thrombocytopenic purpura: Secondary | ICD-10-CM

## 2013-08-02 LAB — CBC WITH DIFFERENTIAL/PLATELET
BASO%: 2.7 % — ABNORMAL HIGH (ref 0.0–2.0)
Basophils Absolute: 0.2 10*3/uL — ABNORMAL HIGH (ref 0.0–0.1)
EOS%: 3.4 % (ref 0.0–7.0)
Eosinophils Absolute: 0.2 10*3/uL (ref 0.0–0.5)
HCT: 43.8 % (ref 38.4–49.9)
HGB: 14.4 g/dL (ref 13.0–17.1)
LYMPH%: 25.5 % (ref 14.0–49.0)
MCH: 29.4 pg (ref 27.2–33.4)
MCHC: 32.8 g/dL (ref 32.0–36.0)
MCV: 89.6 fL (ref 79.3–98.0)
MONO#: 0.5 10*3/uL (ref 0.1–0.9)
MONO%: 8 % (ref 0.0–14.0)
NEUT#: 3.7 10*3/uL (ref 1.5–6.5)
NEUT%: 60.4 % (ref 39.0–75.0)
Platelets: 307 10*3/uL (ref 140–400)
RBC: 4.89 10*6/uL (ref 4.20–5.82)
RDW: 13.4 % (ref 11.0–14.6)
WBC: 6.1 10*3/uL (ref 4.0–10.3)
lymph#: 1.6 10*3/uL (ref 0.9–3.3)

## 2013-08-03 ENCOUNTER — Telehealth: Payer: Self-pay | Admitting: *Deleted

## 2013-08-03 ENCOUNTER — Encounter: Payer: Self-pay | Admitting: Oncology

## 2013-08-03 NOTE — Telephone Encounter (Signed)
Called patient with CBC results. He reports he discontinued his Prednisone 2.5 mg daily on 07/31/13. Will update record and he will recheck counts on 3/17 as scheduled.

## 2013-08-08 ENCOUNTER — Telehealth: Payer: Self-pay | Admitting: *Deleted

## 2013-08-08 NOTE — Telephone Encounter (Signed)
Left message for patient. Per Dr Joretta Bachelor count looks good, no changes.

## 2013-08-16 ENCOUNTER — Other Ambulatory Visit (HOSPITAL_BASED_OUTPATIENT_CLINIC_OR_DEPARTMENT_OTHER): Payer: 59

## 2013-08-16 DIAGNOSIS — D693 Immune thrombocytopenic purpura: Secondary | ICD-10-CM

## 2013-08-16 LAB — CBC WITH DIFFERENTIAL/PLATELET
BASO%: 0.3 % (ref 0.0–2.0)
Basophils Absolute: 0 10*3/uL (ref 0.0–0.1)
EOS ABS: 0.2 10*3/uL (ref 0.0–0.5)
EOS%: 3.4 % (ref 0.0–7.0)
HCT: 44 % (ref 38.4–49.9)
HGB: 14.6 g/dL (ref 13.0–17.1)
LYMPH%: 26.6 % (ref 14.0–49.0)
MCH: 29.1 pg (ref 27.2–33.4)
MCHC: 33.1 g/dL (ref 32.0–36.0)
MCV: 87.9 fL (ref 79.3–98.0)
MONO#: 0.5 10*3/uL (ref 0.1–0.9)
MONO%: 9.6 % (ref 0.0–14.0)
NEUT#: 3.2 10*3/uL (ref 1.5–6.5)
NEUT%: 60.1 % (ref 39.0–75.0)
Platelets: 267 10*3/uL (ref 140–400)
RBC: 5.01 10*6/uL (ref 4.20–5.82)
RDW: 13.4 % (ref 11.0–14.6)
WBC: 5.3 10*3/uL (ref 4.0–10.3)
lymph#: 1.4 10*3/uL (ref 0.9–3.3)

## 2013-08-17 ENCOUNTER — Telehealth: Payer: Self-pay | Admitting: *Deleted

## 2013-08-17 NOTE — Telephone Encounter (Signed)
Message copied by Hebert Soho on Wed Aug 17, 2013  5:08 PM ------      Message from: Deatra Robinson      Created: Tue Aug 16, 2013 11:55 AM       Cbc normal ------

## 2013-08-17 NOTE — Telephone Encounter (Signed)
As noted below by Dr. Humphrey Rolls, the CBC is normal. I left a message on patient's cell phone.

## 2013-08-18 ENCOUNTER — Telehealth: Payer: Self-pay

## 2013-08-18 NOTE — Telephone Encounter (Signed)
Message copied by Prentiss Bells on Thu Aug 18, 2013 12:35 PM ------      Message from: Deatra Robinson      Created: Tue Aug 16, 2013 11:55 AM       Cbc normal ------

## 2013-08-18 NOTE — Telephone Encounter (Signed)
LMVOM - pt to call clinic for results

## 2013-08-30 ENCOUNTER — Other Ambulatory Visit (HOSPITAL_BASED_OUTPATIENT_CLINIC_OR_DEPARTMENT_OTHER): Payer: 59

## 2013-08-30 DIAGNOSIS — D693 Immune thrombocytopenic purpura: Secondary | ICD-10-CM

## 2013-08-30 LAB — CBC WITH DIFFERENTIAL/PLATELET
BASO%: 2 % (ref 0.0–2.0)
Basophils Absolute: 0.1 10*3/uL (ref 0.0–0.1)
EOS%: 4.7 % (ref 0.0–7.0)
Eosinophils Absolute: 0.3 10*3/uL (ref 0.0–0.5)
HEMATOCRIT: 43.8 % (ref 38.4–49.9)
HGB: 14.4 g/dL (ref 13.0–17.1)
LYMPH%: 31.1 % (ref 14.0–49.0)
MCH: 28.8 pg (ref 27.2–33.4)
MCHC: 32.9 g/dL (ref 32.0–36.0)
MCV: 87.7 fL (ref 79.3–98.0)
MONO#: 0.5 10*3/uL (ref 0.1–0.9)
MONO%: 9.3 % (ref 0.0–14.0)
NEUT#: 3 10*3/uL (ref 1.5–6.5)
NEUT%: 52.9 % (ref 39.0–75.0)
PLATELETS: 297 10*3/uL (ref 140–400)
RBC: 4.99 10*6/uL (ref 4.20–5.82)
RDW: 13.7 % (ref 11.0–14.6)
WBC: 5.6 10*3/uL (ref 4.0–10.3)
lymph#: 1.7 10*3/uL (ref 0.9–3.3)

## 2013-09-01 ENCOUNTER — Telehealth: Payer: Self-pay

## 2013-09-01 NOTE — Telephone Encounter (Signed)
Message copied by Prentiss Bells on Thu Sep 01, 2013 12:01 PM ------      Message from: Deatra Robinson      Created: Wed Aug 31, 2013  4:41 PM       Cbc looks good ------

## 2013-09-01 NOTE — Telephone Encounter (Signed)
LMOVM - Per KK note below, lab work is fine.  Call clinic for questions.

## 2013-09-13 ENCOUNTER — Other Ambulatory Visit (HOSPITAL_BASED_OUTPATIENT_CLINIC_OR_DEPARTMENT_OTHER): Payer: 59

## 2013-09-13 DIAGNOSIS — D693 Immune thrombocytopenic purpura: Secondary | ICD-10-CM

## 2013-09-13 LAB — CBC WITH DIFFERENTIAL/PLATELET
BASO%: 2.2 % — AB (ref 0.0–2.0)
Basophils Absolute: 0.1 10*3/uL (ref 0.0–0.1)
EOS%: 4.5 % (ref 0.0–7.0)
Eosinophils Absolute: 0.3 10*3/uL (ref 0.0–0.5)
HCT: 43.6 % (ref 38.4–49.9)
HGB: 14.3 g/dL (ref 13.0–17.1)
LYMPH%: 25.7 % (ref 14.0–49.0)
MCH: 28.6 pg (ref 27.2–33.4)
MCHC: 32.7 g/dL (ref 32.0–36.0)
MCV: 87.4 fL (ref 79.3–98.0)
MONO#: 0.5 10*3/uL (ref 0.1–0.9)
MONO%: 8.3 % (ref 0.0–14.0)
NEUT#: 3.5 10*3/uL (ref 1.5–6.5)
NEUT%: 59.3 % (ref 39.0–75.0)
Platelets: 259 10*3/uL (ref 140–400)
RBC: 4.99 10*6/uL (ref 4.20–5.82)
RDW: 13.9 % (ref 11.0–14.6)
WBC: 5.9 10*3/uL (ref 4.0–10.3)
lymph#: 1.5 10*3/uL (ref 0.9–3.3)

## 2013-09-14 ENCOUNTER — Telehealth: Payer: Self-pay

## 2013-09-14 NOTE — Telephone Encounter (Signed)
Lennex, Pietila - 09/13/13 ','<<< Less Detail       Deatra Robinson, MD       Sent: Wed September 14, 2013 7:54 AM    To: Hoy Finlay, RN; Prentiss Bells, RN               Result Note     Cbc looks great    LMOVM - per note KK LM that results of last test looked great.  Pt to call clinic if more detail is needed.

## 2013-09-27 ENCOUNTER — Telehealth: Payer: Self-pay | Admitting: Oncology

## 2013-09-27 ENCOUNTER — Other Ambulatory Visit (HOSPITAL_BASED_OUTPATIENT_CLINIC_OR_DEPARTMENT_OTHER): Payer: 59

## 2013-09-27 DIAGNOSIS — D472 Monoclonal gammopathy: Secondary | ICD-10-CM

## 2013-09-27 DIAGNOSIS — D693 Immune thrombocytopenic purpura: Secondary | ICD-10-CM

## 2013-09-27 LAB — CBC WITH DIFFERENTIAL/PLATELET
BASO%: 3.1 % — ABNORMAL HIGH (ref 0.0–2.0)
BASOS ABS: 0.2 10*3/uL — AB (ref 0.0–0.1)
EOS%: 5 % (ref 0.0–7.0)
Eosinophils Absolute: 0.3 10*3/uL (ref 0.0–0.5)
HCT: 45.6 % (ref 38.4–49.9)
HGB: 14.8 g/dL (ref 13.0–17.1)
LYMPH%: 26.2 % (ref 14.0–49.0)
MCH: 28.2 pg (ref 27.2–33.4)
MCHC: 32.6 g/dL (ref 32.0–36.0)
MCV: 86.5 fL (ref 79.3–98.0)
MONO#: 0.5 10*3/uL (ref 0.1–0.9)
MONO%: 8.7 % (ref 0.0–14.0)
NEUT%: 57 % (ref 39.0–75.0)
NEUTROS ABS: 3.3 10*3/uL (ref 1.5–6.5)
Platelets: 289 10*3/uL (ref 140–400)
RBC: 5.27 10*6/uL (ref 4.20–5.82)
RDW: 14.1 % (ref 11.0–14.6)
WBC: 5.9 10*3/uL (ref 4.0–10.3)
lymph#: 1.5 10*3/uL (ref 0.9–3.3)

## 2013-09-27 NOTE — Telephone Encounter (Signed)
kk out  - pt to see NG 5/13 - date per NG. s/w pt he is aware.

## 2013-10-11 ENCOUNTER — Other Ambulatory Visit: Payer: 59

## 2013-10-11 ENCOUNTER — Ambulatory Visit: Payer: 59 | Admitting: Oncology

## 2013-10-12 ENCOUNTER — Ambulatory Visit (HOSPITAL_BASED_OUTPATIENT_CLINIC_OR_DEPARTMENT_OTHER): Payer: 59 | Admitting: Hematology and Oncology

## 2013-10-12 ENCOUNTER — Other Ambulatory Visit (HOSPITAL_BASED_OUTPATIENT_CLINIC_OR_DEPARTMENT_OTHER): Payer: 59

## 2013-10-12 ENCOUNTER — Encounter: Payer: Self-pay | Admitting: Hematology and Oncology

## 2013-10-12 VITALS — BP 125/80 | HR 67 | Temp 98.2°F | Resp 18 | Ht 67.0 in | Wt 208.7 lb

## 2013-10-12 DIAGNOSIS — D599 Acquired hemolytic anemia, unspecified: Secondary | ICD-10-CM

## 2013-10-12 DIAGNOSIS — D589 Hereditary hemolytic anemia, unspecified: Secondary | ICD-10-CM

## 2013-10-12 DIAGNOSIS — D693 Immune thrombocytopenic purpura: Secondary | ICD-10-CM

## 2013-10-12 LAB — CBC WITH DIFFERENTIAL/PLATELET
BASO%: 0.3 % (ref 0.0–2.0)
BASOS ABS: 0 10*3/uL (ref 0.0–0.1)
EOS ABS: 0.3 10*3/uL (ref 0.0–0.5)
EOS%: 3.7 % (ref 0.0–7.0)
HCT: 44.9 % (ref 38.4–49.9)
HGB: 14.6 g/dL (ref 13.0–17.1)
LYMPH%: 26.8 % (ref 14.0–49.0)
MCH: 28 pg (ref 27.2–33.4)
MCHC: 32.6 g/dL (ref 32.0–36.0)
MCV: 85.6 fL (ref 79.3–98.0)
MONO#: 0.7 10*3/uL (ref 0.1–0.9)
MONO%: 9.2 % (ref 0.0–14.0)
NEUT%: 60 % (ref 39.0–75.0)
NEUTROS ABS: 4.6 10*3/uL (ref 1.5–6.5)
Platelets: 312 10*3/uL (ref 140–400)
RBC: 5.24 10*6/uL (ref 4.20–5.82)
RDW: 14.7 % — ABNORMAL HIGH (ref 11.0–14.6)
WBC: 7.7 10*3/uL (ref 4.0–10.3)
lymph#: 2.1 10*3/uL (ref 0.9–3.3)

## 2013-10-13 NOTE — Progress Notes (Signed)
[place] FOLLOW-UP progress notes  Patient Care Team: Dibas Koirala, MD as PCP - General (Family Medicine)  CHIEF COMPLAINTS/PURPOSE OF VISIT:  History of recurrent ITP and or anemia hemolytic anemia, no evidence of recurrence  HISTORY OF PRESENTING ILLNESS:  Kenneth Juarez 53 y.o. male was transferred to my care after his prior physician has left.  I reviewed the patient's records extensive and collaborated the history with the patient. Summary of his history is as follows: This patient was originally diagnosed with ITP in 2008 after presentation with petechiae. He was transfused. He had CT scan in 2008 which show no lymphadenopathy or splenomegaly. He was successfully treated with IVIG and prednisone therapy.  In 2010, his ITP recurred. Again, he had CT scan of the abdomen and pelvis which showed no abnormalities. The patient never had bone marrow aspirate and biopsy. He was again treated with IVIG and prednisone and was successfully tapered off. In 2014, he was admitted to the hospital after presentation with dizziness and severe anemia, suspect autoimmune hemolytic anemia. He was started on prednisone again and was off prednisone for the last 8 weeks. While on prednisone, the patient complained of more irritability and weight gain.  He feels well. He denies any recent fever, chills, night sweats or abnormal weight loss The patient denies any recent signs or symptoms of bleeding such as spontaneous epistaxis, hematuria or hematochezia.   MEDICAL HISTORY:  Past Medical History  Diagnosis Date  . Depression   . Hypertension   . ITP (idiopathic thrombocytopenic purpura)   . Hemolytic anemia 05/03/2013    SURGICAL HISTORY: History reviewed. No pertinent past surgical history.  SOCIAL HISTORY: History   Social History  . Marital Status: Single    Spouse Name: N/A    Number of Children: N/A  . Years of Education: N/A   Occupational History  . Not on file.    Social History Main Topics  . Smoking status: Former Smoker -- 2.00 packs/day for 5 years    Quit date: 06/02/1996  . Smokeless tobacco: Never Used  . Alcohol Use: No  . Drug Use: No  . Sexual Activity: Yes   Other Topics Concern  . Not on file   Social History Narrative  . No narrative on file    FAMILY HISTORY: History reviewed. No pertinent family history.  ALLERGIES:  has No Known Allergies.  MEDICATIONS:  Current Outpatient Prescriptions  Medication Sig Dispense Refill  . atenolol (TENORMIN) 25 MG tablet Take 25 mg by mouth daily.      Marland Kitchen atorvastatin (LIPITOR) 10 MG tablet Take 10 mg by mouth daily.      Marland Kitchen lisinopril-hydrochlorothiazide (PRINZIDE,ZESTORETIC) 10-12.5 MG per tablet Take 1 tablet by mouth daily.      Marland Kitchen PARoxetine (PAXIL) 20 MG tablet Take 20 mg by mouth daily.       No current facility-administered medications for this visit.    REVIEW OF SYSTEMS:   Constitutional: Denies fevers, chills or abnormal night sweats Eyes: Denies blurriness of vision, double vision or watery eyes Ears, nose, mouth, throat, and face: Denies mucositis or sore throat Respiratory: Denies cough, dyspnea or wheezes Cardiovascular: Denies palpitation, chest discomfort or lower extremity swelling Gastrointestinal:  Denies nausea, heartburn or change in bowel habits Skin: Denies abnormal skin rashes Lymphatics: Denies new lymphadenopathy or easy bruising Neurological:Denies numbness, tingling or new weaknesses Behavioral/Psych: Mood is stable, no new changes  All other systems were reviewed with the patient and are negative.  PHYSICAL  EXAMINATION: ECOG PERFORMANCE STATUS: 0 - Asymptomatic  Filed Vitals:   10/12/13 1310  BP: 125/80  Pulse: 67  Temp: 98.2 F (36.8 C)  Resp: 18   Filed Weights   10/12/13 1310  Weight: 208 lb 11.2 oz (94.666 kg)    GENERAL:alert, no distress and comfortable. He looks obese. He also appears to be mildly cushingoid SKIN: skin color,  texture, turgor are normal, no rashes or significant lesions EYES: normal, conjunctiva are pink and non-injected, sclera clear OROPHARYNX:no exudate, normal lips, buccal mucosa, and tongue  NECK: supple, thyroid normal size, non-tender, without nodularity LYMPH:  no palpable lymphadenopathy in the cervical, axillary or inguinal LUNGS: clear to auscultation and percussion with normal breathing effort HEART: regular rate & rhythm and no murmurs without lower extremity edema ABDOMEN:abdomen soft, non-tender and normal bowel sounds. No splenomegaly Musculoskeletal:no cyanosis of digits and no clubbing  PSYCH: alert & oriented x 3 with fluent speech NEURO: no focal motor/sensory deficits  LABORATORY DATA:  I have reviewed the data as listed Lab Results  Component Value Date   WBC 7.7 10/12/2013   HGB 14.6 10/12/2013   HCT 44.9 10/12/2013   MCV 85.6 10/12/2013   PLT 312 10/12/2013    Recent Labs  04/25/13 1100 05/03/13 1051  NA 133* 137  K 4.1 3.7  CL 99  --   CO2 22 20*  GLUCOSE 104* 105  BUN 17 24.7  CREATININE 1.19 1.3  CALCIUM 9.1 9.2  GFRNONAA 69*  --   GFRAA 80*  --   PROT 6.9 6.4  ALBUMIN 4.1 3.6  AST 27 13  ALT 30 23  ALKPHOS 79 78  BILITOT 1.5* 1.06    RADIOGRAPHIC STUDIES: I reviewed his prior imaging studies I have personally reviewed the radiological images as listed and agreed with the findings in the report.  ASSESSMENT & PLAN:  #1 history of recurrent ITP There is no evidence of recurrence. He was just successfully taper of prednisone not long ago. I recommend close observation with repeat blood work, history and physical examination in 3 months. #2 hemolytic anemia He had history of autoimmune hemolytic anemia. I discussed with him the loose association of his clinical history with possible diagnosis of lymphoma. I educated to the patient the signs and symptoms to watch out for possible diagnosis of lymphoma #3 preventive care I recommend vitamin D  supplementation.  Orders Placed This Encounter  Procedures  . CBC with Differential    Standing Status: Future     Number of Occurrences:      Standing Expiration Date: 10/12/2014  . Lactate dehydrogenase    Standing Status: Future     Number of Occurrences:      Standing Expiration Date: 10/12/2014  . Comprehensive metabolic panel    Standing Status: Future     Number of Occurrences:      Standing Expiration Date: 10/12/2014    All questions were answered. The patient knows to call the clinic with any problems, questions or concerns. I spent 25 minutes counseling the patient face to face. The total time spent in the appointment was 30 minutes and more than 50% was on counseling.     Heath Lark, MD 10/13/2013 8:00 AM

## 2013-10-15 ENCOUNTER — Telehealth: Payer: Self-pay | Admitting: Hematology and Oncology

## 2013-10-15 NOTE — Telephone Encounter (Signed)
s.w. pt and advised on Aug appt...ok and aware pt saw appt on my chart

## 2014-01-06 ENCOUNTER — Encounter: Payer: Self-pay | Admitting: Hematology and Oncology

## 2014-01-06 ENCOUNTER — Ambulatory Visit (HOSPITAL_BASED_OUTPATIENT_CLINIC_OR_DEPARTMENT_OTHER): Payer: 59 | Admitting: Hematology and Oncology

## 2014-01-06 ENCOUNTER — Other Ambulatory Visit (HOSPITAL_BASED_OUTPATIENT_CLINIC_OR_DEPARTMENT_OTHER): Payer: 59

## 2014-01-06 VITALS — BP 123/69 | HR 71 | Temp 98.6°F | Resp 18 | Ht 67.0 in | Wt 208.7 lb

## 2014-01-06 DIAGNOSIS — D589 Hereditary hemolytic anemia, unspecified: Secondary | ICD-10-CM

## 2014-01-06 DIAGNOSIS — D693 Immune thrombocytopenic purpura: Secondary | ICD-10-CM

## 2014-01-06 DIAGNOSIS — D599 Acquired hemolytic anemia, unspecified: Secondary | ICD-10-CM

## 2014-01-06 LAB — CBC WITH DIFFERENTIAL/PLATELET
BASO%: 1.9 % (ref 0.0–2.0)
BASOS ABS: 0.1 10*3/uL (ref 0.0–0.1)
EOS%: 4.6 % (ref 0.0–7.0)
Eosinophils Absolute: 0.3 10*3/uL (ref 0.0–0.5)
HEMATOCRIT: 45.2 % (ref 38.4–49.9)
HEMOGLOBIN: 14.6 g/dL (ref 13.0–17.1)
LYMPH%: 24.4 % (ref 14.0–49.0)
MCH: 28.1 pg (ref 27.2–33.4)
MCHC: 32.4 g/dL (ref 32.0–36.0)
MCV: 86.9 fL (ref 79.3–98.0)
MONO#: 0.5 10*3/uL (ref 0.1–0.9)
MONO%: 8.5 % (ref 0.0–14.0)
NEUT%: 60.6 % (ref 39.0–75.0)
NEUTROS ABS: 3.5 10*3/uL (ref 1.5–6.5)
Platelets: 272 10*3/uL (ref 140–400)
RBC: 5.2 10*6/uL (ref 4.20–5.82)
RDW: 14.1 % (ref 11.0–14.6)
WBC: 5.8 10*3/uL (ref 4.0–10.3)
lymph#: 1.4 10*3/uL (ref 0.9–3.3)

## 2014-01-06 LAB — LACTATE DEHYDROGENASE (CC13): LDH: 193 U/L (ref 125–245)

## 2014-01-06 LAB — COMPREHENSIVE METABOLIC PANEL (CC13)
ALT: 24 U/L (ref 0–55)
ANION GAP: 10 meq/L (ref 3–11)
AST: 17 U/L (ref 5–34)
Albumin: 3.6 g/dL (ref 3.5–5.0)
Alkaline Phosphatase: 79 U/L (ref 40–150)
BILIRUBIN TOTAL: 0.66 mg/dL (ref 0.20–1.20)
BUN: 13.6 mg/dL (ref 7.0–26.0)
CO2: 23 mEq/L (ref 22–29)
CREATININE: 1.2 mg/dL (ref 0.7–1.3)
Calcium: 9.4 mg/dL (ref 8.4–10.4)
Chloride: 104 mEq/L (ref 98–109)
GLUCOSE: 151 mg/dL — AB (ref 70–140)
Potassium: 3.9 mEq/L (ref 3.5–5.1)
Sodium: 137 mEq/L (ref 136–145)
TOTAL PROTEIN: 6.5 g/dL (ref 6.4–8.3)

## 2014-01-06 NOTE — Progress Notes (Signed)
[place], MD SUMMARY OF HEMATOLOGIC HISTORY: This patient was originally diagnosed with ITP in 2008 after presentation with petechiae. He was transfused. He had CT scan in 2008 which show no lymphadenopathy or splenomegaly. He was successfully treated with IVIG and prednisone therapy.  In 2010, his ITP recurred. Again, he had CT scan of the abdomen and pelvis which showed no abnormalities. The patient never had bone marrow aspirate and biopsy. He was again treated with IVIG and prednisone and was successfully tapered off. In 2014, he was admitted to the hospital after presentation with dizziness and severe anemia, suspect autoimmune hemolytic anemia. He was started on prednisone again and was off prednisone since 2014. INTERVAL HISTORY: Kenneth Juarez 53 y.o. male returns for further followup. He has excellent energy level. Denies recent bruising or bleeding.  I have reviewed the past medical history, past surgical history, social history and family history with the patient and they are unchanged from previous note.  ALLERGIES:  has No Known Allergies.  MEDICATIONS:  Current Outpatient Prescriptions  Medication Sig Dispense Refill  . aspirin 81 MG tablet Take 81 mg by mouth daily.      Marland Kitchen atenolol (TENORMIN) 25 MG tablet Take 25 mg by mouth daily.      Marland Kitchen atorvastatin (LIPITOR) 10 MG tablet Take 10 mg by mouth daily.      Marland Kitchen lisinopril-hydrochlorothiazide (PRINZIDE,ZESTORETIC) 10-12.5 MG per tablet Take 1 tablet by mouth daily.      Marland Kitchen PARoxetine (PAXIL) 20 MG tablet Take 20 mg by mouth daily.       No current facility-administered medications for this visit.     REVIEW OF SYSTEMS:   Constitutional: Denies fevers, chills or night sweats Eyes: Denies blurriness of vision Ears, nose, mouth, throat, and face: Denies mucositis or sore throat Respiratory: Denies cough, dyspnea or wheezes Cardiovascular: Denies palpitation, chest  discomfort or lower extremity swelling Gastrointestinal:  Denies nausea, heartburn or change in bowel habits Skin: Denies abnormal skin rashes Lymphatics: Denies new lymphadenopathy or easy bruising Neurological:Denies numbness, tingling or new weaknesses Behavioral/Psych: Mood is stable, no new changes  All other systems were reviewed with the patient and are negative.  PHYSICAL EXAMINATION: ECOG PERFORMANCE STATUS: 0 - Asymptomatic  Filed Vitals:   01/06/14 0931  BP: 123/69  Pulse: 71  Temp: 98.6 F (37 C)  Resp: 18   Filed Weights   01/06/14 0931  Weight: 208 lb 11.2 oz (94.666 kg)    GENERAL:alert, no distress and comfortable SKIN: skin color, texture, turgor are normal, no rashes or significant lesions EYES: normal, Conjunctiva are pink and non-injected, sclera clear Musculoskeletal:no cyanosis of digits and no clubbing  NEURO: alert & oriented x 3 with fluent speech, no focal motor/sensory deficits  LABORATORY DATA:  I have reviewed the data as listed Results for orders placed in visit on 01/06/14 (from the past 48 hour(s))  CBC WITH DIFFERENTIAL     Status: None   Collection Time    01/06/14  9:22 AM      Result Value Ref Range   WBC 5.8  4.0 - 10.3 10e3/uL   NEUT# 3.5  1.5 - 6.5 10e3/uL   HGB 14.6  13.0 - 17.1 g/dL   HCT 45.2  38.4 - 49.9 %   Platelets 272  140 - 400 10e3/uL   MCV 86.9  79.3 - 98.0 fL   MCH 28.1  27.2 - 33.4 pg   MCHC 32.4  32.0 -  36.0 g/dL   RBC 5.20  4.20 - 5.82 10e6/uL   RDW 14.1  11.0 - 14.6 %   lymph# 1.4  0.9 - 3.3 10e3/uL   MONO# 0.5  0.1 - 0.9 10e3/uL   Eosinophils Absolute 0.3  0.0 - 0.5 10e3/uL   Basophils Absolute 0.1  0.0 - 0.1 10e3/uL   NEUT% 60.6  39.0 - 75.0 %   LYMPH% 24.4  14.0 - 49.0 %   MONO% 8.5  0.0 - 14.0 %   EOS% 4.6  0.0 - 7.0 %   BASO% 1.9  0.0 - 2.0 %  LACTATE DEHYDROGENASE (CC13)     Status: None   Collection Time    01/06/14  9:22 AM      Result Value Ref Range   LDH 193  125 - 245 U/L  COMPREHENSIVE  METABOLIC PANEL (GU44)     Status: Abnormal   Collection Time    01/06/14  9:22 AM      Result Value Ref Range   Sodium 137  136 - 145 mEq/L   Potassium 3.9  3.5 - 5.1 mEq/L   Chloride 104  98 - 109 mEq/L   CO2 23  22 - 29 mEq/L   Glucose 151 (*) 70 - 140 mg/dl   BUN 13.6  7.0 - 26.0 mg/dL   Creatinine 1.2  0.7 - 1.3 mg/dL   Total Bilirubin 0.66  0.20 - 1.20 mg/dL   Alkaline Phosphatase 79  40 - 150 U/L   AST 17  5 - 34 U/L   ALT 24  0 - 55 U/L   Total Protein 6.5  6.4 - 8.3 g/dL   Albumin 3.6  3.5 - 5.0 g/dL   Calcium 9.4  8.4 - 10.4 mg/dL   Anion Gap 10  3 - 11 mEq/L    Lab Results  Component Value Date   WBC 5.8 01/06/2014   HGB 14.6 01/06/2014   HCT 45.2 01/06/2014   MCV 86.9 01/06/2014   PLT 272 01/06/2014    ASSESSMENT & PLAN:  ITP (idiopathic thrombocytopenic purpura) This has not recurred. I recommend followup with PCP only.  Hemolytic anemia This has not recurred. I recommend followup with PCP only.     All questions were answered. The patient knows to call the clinic with any problems, questions or concerns. No barriers to learning was detected.  I spent 15 minutes counseling the patient face to face. The total time spent in the appointment was 20 minutes and more than 50% was on counseling.     [facility], [place], MD 01/06/2014 1:52 PM

## 2014-01-06 NOTE — Assessment & Plan Note (Signed)
This has not recurred. I recommend followup with PCP only.

## 2014-12-04 IMAGING — CR DG CHEST 2V
2 series · 2 of 2 positions shown · non-contrast
Comparison: None.

CLINICAL DATA: Shortness of breath, hypertension

EXAM:
CHEST  2 VIEW

[w chest pa]
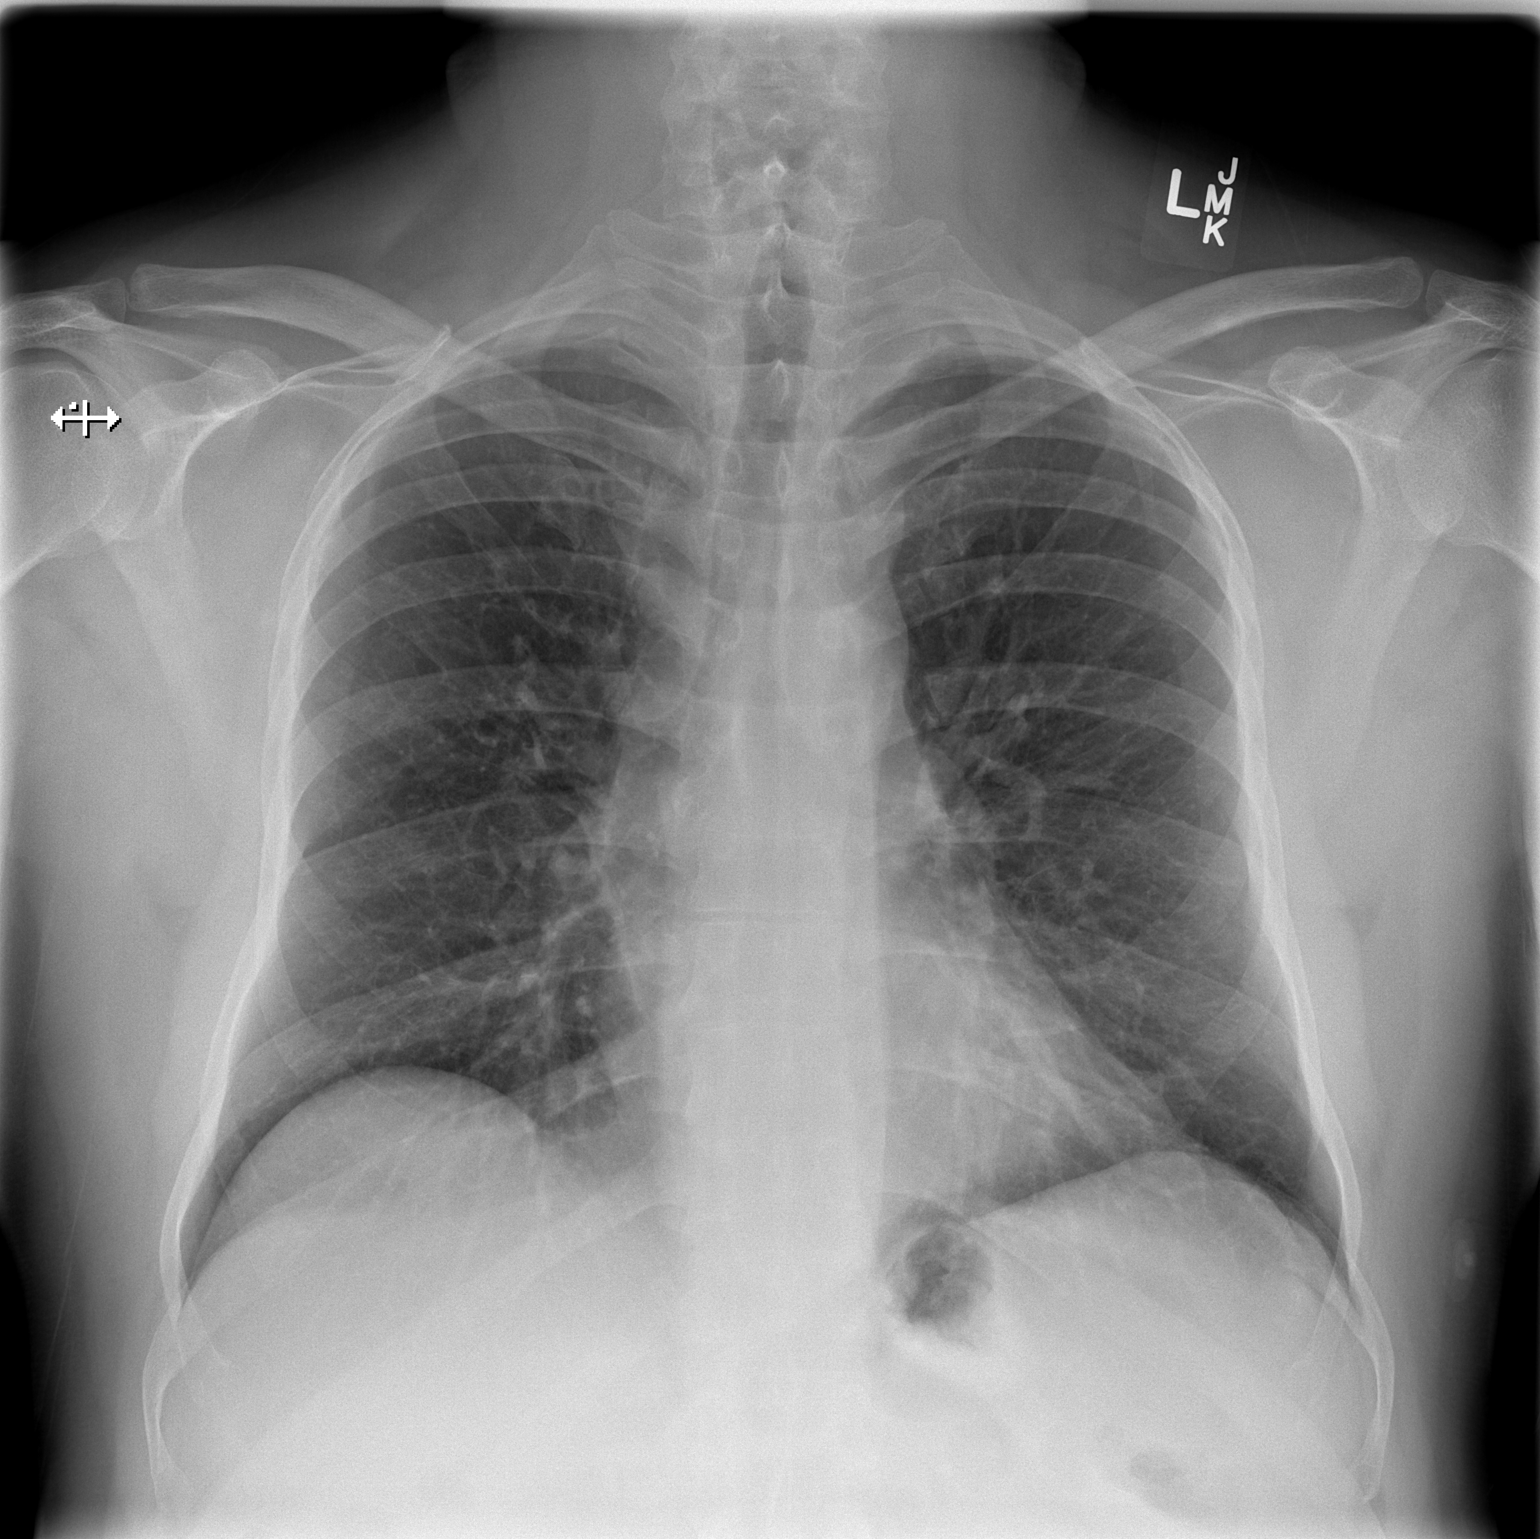

[w chest lat]
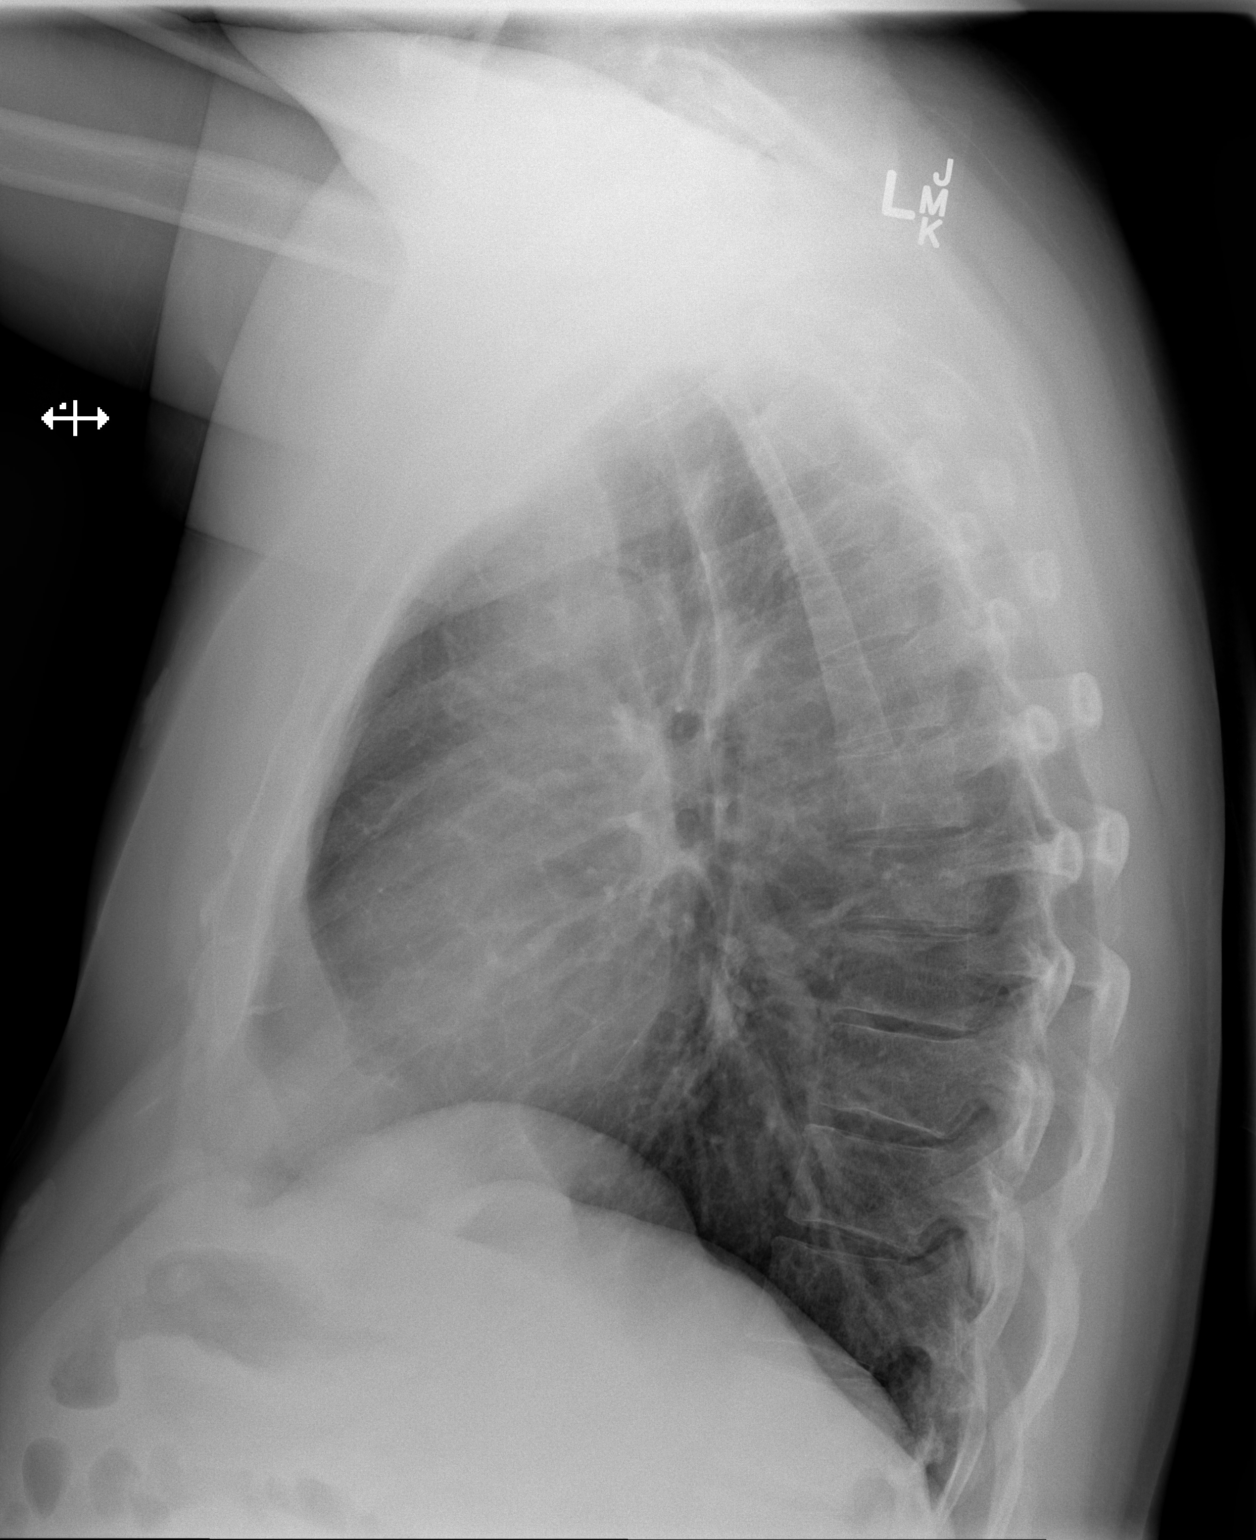

[2 of 2 positions shown; findings below may reference images not displayed]

FINDINGS: Normal heart size and vascularity. No focal pneumonia, collapse or
consolidation. No effusion or pneumothorax. Trachea midline.
IMPRESSION: No acute chest process

## 2019-01-26 ENCOUNTER — Inpatient Hospital Stay (HOSPITAL_COMMUNITY)
Admission: EM | Admit: 2019-01-26 | Discharge: 2019-01-29 | DRG: 813 | Disposition: A | Discharge status: Home / Self Care | Payer: BC Managed Care – PPO | Source: Ambulatory Visit | Attending: Internal Medicine | Admitting: Internal Medicine

## 2019-01-26 ENCOUNTER — Other Ambulatory Visit: Payer: Self-pay

## 2019-01-26 ENCOUNTER — Encounter (HOSPITAL_COMMUNITY): Payer: Self-pay

## 2019-01-26 DIAGNOSIS — Z87891 Personal history of nicotine dependence: Secondary | ICD-10-CM

## 2019-01-26 DIAGNOSIS — I10 Essential (primary) hypertension: Secondary | ICD-10-CM | POA: Diagnosis present

## 2019-01-26 DIAGNOSIS — Z20828 Contact with and (suspected) exposure to other viral communicable diseases: Secondary | ICD-10-CM | POA: Diagnosis present

## 2019-01-26 DIAGNOSIS — F418 Other specified anxiety disorders: Secondary | ICD-10-CM | POA: Diagnosis not present

## 2019-01-26 DIAGNOSIS — D696 Thrombocytopenia, unspecified: Secondary | ICD-10-CM | POA: Diagnosis not present

## 2019-01-26 DIAGNOSIS — D693 Immune thrombocytopenic purpura: Principal | ICD-10-CM | POA: Diagnosis present

## 2019-01-26 DIAGNOSIS — Z03818 Encounter for observation for suspected exposure to other biological agents ruled out: Secondary | ICD-10-CM | POA: Diagnosis not present

## 2019-01-26 LAB — COMPREHENSIVE METABOLIC PANEL
ALT: 26 U/L (ref 0–44)
AST: 23 U/L (ref 15–41)
Albumin: 3.8 g/dL (ref 3.5–5.0)
Alkaline Phosphatase: 72 U/L (ref 38–126)
Anion gap: 11 (ref 5–15)
BUN: 14 mg/dL (ref 6–20)
CO2: 22 mmol/L (ref 22–32)
Calcium: 9.1 mg/dL (ref 8.9–10.3)
Chloride: 100 mmol/L (ref 98–111)
Creatinine, Ser: 1.3 mg/dL — ABNORMAL HIGH (ref 0.61–1.24)
GFR calc Af Amer: >60 mL/min (ref >60)
GFR calc non Af Amer: >60 mL/min (ref >60)
Glucose, Bld: 183 mg/dL — ABNORMAL HIGH (ref 70–99)
Potassium: 3.6 mmol/L (ref 3.5–5.1)
Sodium: 133 mmol/L — ABNORMAL LOW (ref 135–145)
Total Bilirubin: 1 mg/dL (ref 0.3–1.2)
Total Protein: 6.3 g/dL — ABNORMAL LOW (ref 6.5–8.1)

## 2019-01-26 LAB — CBC WITH DIFFERENTIAL/PLATELET
Abs Immature Granulocytes: 0.03 10*3/uL (ref 0.00–0.07)
Basophils Absolute: 0.1 10*3/uL (ref 0.0–0.1)
Basophils Relative: 1 %
Eosinophils Absolute: 0.3 10*3/uL (ref 0.0–0.5)
Eosinophils Relative: 3 %
HCT: 45.7 % (ref 39.0–52.0)
Hemoglobin: 15.2 g/dL (ref 13.0–17.0)
Immature Granulocytes: 0 %
Lymphocytes Relative: 19 %
Lymphs Abs: 1.6 10*3/uL (ref 0.7–4.0)
MCH: 29.1 pg (ref 26.0–34.0)
MCHC: 33.3 g/dL (ref 30.0–36.0)
MCV: 87.4 fL (ref 80.0–100.0)
Monocytes Absolute: 0.8 10*3/uL (ref 0.1–1.0)
Monocytes Relative: 9 %
Neutro Abs: 5.6 10*3/uL (ref 1.7–7.7)
Neutrophils Relative %: 68 %
Platelets: <5 10*3/uL — CL (ref 150–400)
RBC: 5.23 MIL/uL (ref 4.22–5.81)
RDW: 12.6 % (ref 11.5–15.5)
WBC: 8.4 10*3/uL (ref 4.0–10.5)
nRBC: 0 % (ref 0.0–0.2)

## 2019-01-26 LAB — SAMPLE TO BLOOD BANK

## 2019-01-26 NOTE — ED Notes (Signed)
Ria in lab advised that they have a blue top on patient to run aptt and inr

## 2019-01-26 NOTE — ED Triage Notes (Signed)
Pt arrives POV for low platelets, states under 1000. Reports he noted petechial rash this AM from shower and had platelets checked at PCP. Report hx of ITP in 2010, states this is similar. Pt denies symptoms

## 2019-01-27 ENCOUNTER — Other Ambulatory Visit: Payer: Self-pay

## 2019-01-27 ENCOUNTER — Encounter (HOSPITAL_COMMUNITY): Payer: Self-pay | Admitting: Family Medicine

## 2019-01-27 DIAGNOSIS — Z87891 Personal history of nicotine dependence: Secondary | ICD-10-CM | POA: Diagnosis not present

## 2019-01-27 DIAGNOSIS — I10 Essential (primary) hypertension: Secondary | ICD-10-CM | POA: Diagnosis present

## 2019-01-27 DIAGNOSIS — F418 Other specified anxiety disorders: Secondary | ICD-10-CM | POA: Diagnosis present

## 2019-01-27 DIAGNOSIS — D693 Immune thrombocytopenic purpura: Secondary | ICD-10-CM | POA: Diagnosis present

## 2019-01-27 DIAGNOSIS — D696 Thrombocytopenia, unspecified: Secondary | ICD-10-CM | POA: Diagnosis present

## 2019-01-27 DIAGNOSIS — Z20828 Contact with and (suspected) exposure to other viral communicable diseases: Secondary | ICD-10-CM | POA: Diagnosis present

## 2019-01-27 LAB — CBC WITH DIFFERENTIAL/PLATELET
Abs Immature Granulocytes: 0.02 10*3/uL (ref 0.00–0.07)
Basophils Absolute: 0 10*3/uL (ref 0.0–0.1)
Basophils Relative: 1 %
Eosinophils Absolute: 0 10*3/uL (ref 0.0–0.5)
Eosinophils Relative: 0 %
HCT: 44.3 % (ref 39.0–52.0)
Hemoglobin: 15 g/dL (ref 13.0–17.0)
Immature Granulocytes: 0 %
Lymphocytes Relative: 10 %
Lymphs Abs: 0.7 10*3/uL (ref 0.7–4.0)
MCH: 29 pg (ref 26.0–34.0)
MCHC: 33.9 g/dL (ref 30.0–36.0)
MCV: 85.7 fL (ref 80.0–100.0)
Monocytes Absolute: 0.1 10*3/uL (ref 0.1–1.0)
Monocytes Relative: 2 %
Neutro Abs: 5.8 10*3/uL (ref 1.7–7.7)
Neutrophils Relative %: 87 %
Platelets: <5 10*3/uL — CL (ref 150–400)
RBC: 5.17 MIL/uL (ref 4.22–5.81)
RDW: 12.5 % (ref 11.5–15.5)
WBC: 6.7 10*3/uL (ref 4.0–10.5)
nRBC: 0 % (ref 0.0–0.2)

## 2019-01-27 LAB — RETICULOCYTES
Immature Retic Fract: 9.4 % (ref 2.3–15.9)
RBC.: 5.06 MIL/uL (ref 4.22–5.81)
Retic Count, Absolute: 84.5 10*3/uL (ref 19.0–186.0)
Retic Ct Pct: 1.7 % (ref 0.4–3.1)

## 2019-01-27 LAB — SARS CORONAVIRUS 2 (TAT 6-24 HRS): SARS Coronavirus 2: NEGATIVE

## 2019-01-27 LAB — PATHOLOGIST SMEAR REVIEW

## 2019-01-27 LAB — IMMATURE PLATELET FRACTION: Immature Platelet Fraction: <5 % (ref 1.2–8.6)

## 2019-01-27 LAB — PROTIME-INR
INR: 1 (ref 0.8–1.2)
Prothrombin Time: 12.9 seconds (ref 11.4–15.2)

## 2019-01-27 LAB — BASIC METABOLIC PANEL
Anion gap: 10 (ref 5–15)
BUN: 10 mg/dL (ref 6–20)
CO2: 23 mmol/L (ref 22–32)
Calcium: 9.3 mg/dL (ref 8.9–10.3)
Chloride: 101 mmol/L (ref 98–111)
Creatinine, Ser: 1.15 mg/dL (ref 0.61–1.24)
GFR calc Af Amer: >60 mL/min (ref >60)
GFR calc non Af Amer: >60 mL/min (ref >60)
Glucose, Bld: 133 mg/dL — ABNORMAL HIGH (ref 70–99)
Potassium: 4.1 mmol/L (ref 3.5–5.1)
Sodium: 134 mmol/L — ABNORMAL LOW (ref 135–145)

## 2019-01-27 LAB — APTT: aPTT: 24 seconds (ref 24–36)

## 2019-01-27 LAB — SAVE SMEAR(SSMR), FOR PROVIDER SLIDE REVIEW

## 2019-01-27 LAB — HIV ANTIBODY (ROUTINE TESTING W REFLEX): HIV Screen 4th Generation wRfx: NONREACTIVE

## 2019-01-27 LAB — DIC (DISSEMINATED INTRAVASCULAR COAGULATION)PANEL
D-Dimer, Quant: 0.33 ug/mL-FEU (ref 0.00–0.50)
Fibrinogen: 390 mg/dL (ref 210–475)
INR: 1.1 (ref 0.8–1.2)
Platelets: <5 10*3/uL — CL (ref 150–400)
Prothrombin Time: 13.8 seconds (ref 11.4–15.2)
Smear Review: NONE SEEN
aPTT: 22 seconds — ABNORMAL LOW (ref 24–36)

## 2019-01-27 LAB — LACTATE DEHYDROGENASE: LDH: 186 U/L (ref 98–192)

## 2019-01-27 LAB — TYPE AND SCREEN
ABO/RH(D): A POS
Antibody Screen: NEGATIVE

## 2019-01-27 MED ORDER — LISINOPRIL 10 MG PO TABS
10.0000 mg | ORAL_TABLET | Freq: Every day | ORAL | Status: DC
  Administered 2019-01-27 – 2019-01-29 (×3): 10 mg via ORAL
  Filled 2019-01-27 (×3): qty 1

## 2019-01-27 MED ORDER — SODIUM CHLORIDE 0.9% FLUSH
3.0000 mL | Freq: Two times a day (BID) | INTRAVENOUS | Status: DC
  Administered 2019-01-28 – 2019-01-29 (×2): 3 mL via INTRAVENOUS

## 2019-01-27 MED ORDER — LISINOPRIL-HYDROCHLOROTHIAZIDE 10-12.5 MG PO TABS: 1.0000 | ORAL_TABLET | Freq: Every day | ORAL | Status: DC

## 2019-01-27 MED ORDER — HYDROCODONE-ACETAMINOPHEN 5-325 MG PO TABS: 1.0000 | ORAL_TABLET | ORAL | Status: DC | PRN

## 2019-01-27 MED ORDER — PREDNISONE 20 MG PO TABS
ORAL_TABLET | ORAL | Status: AC
  Filled 2019-01-27: qty 4

## 2019-01-27 MED ORDER — ONDANSETRON HCL 4 MG PO TABS: 4.0000 mg | ORAL_TABLET | Freq: Four times a day (QID) | ORAL | Status: DC | PRN

## 2019-01-27 MED ORDER — SODIUM CHLORIDE 0.9 % IV SOLN: 250.0000 mL | INTRAVENOUS | Status: DC | PRN

## 2019-01-27 MED ORDER — PREDNISONE 50 MG PO TABS
80.0000 mg | ORAL_TABLET | Freq: Every day | ORAL | Status: DC
  Administered 2019-01-27 – 2019-01-29 (×3): 80 mg via ORAL
  Filled 2019-01-27 (×3): qty 1

## 2019-01-27 MED ORDER — ATORVASTATIN CALCIUM 10 MG PO TABS
20.0000 mg | ORAL_TABLET | Freq: Every evening | ORAL | Status: DC
  Administered 2019-01-27 – 2019-01-28 (×2): 20 mg via ORAL
  Filled 2019-01-27 (×2): qty 2

## 2019-01-27 MED ORDER — ONDANSETRON HCL 4 MG/2ML IJ SOLN: 4.0000 mg | Freq: Four times a day (QID) | INTRAMUSCULAR | Status: DC | PRN

## 2019-01-27 MED ORDER — SODIUM CHLORIDE 0.9% FLUSH: 3.0000 mL | INTRAVENOUS | Status: DC | PRN

## 2019-01-27 MED ORDER — IMMUNE GLOBULIN (HUMAN) 20 GM/200ML IV SOLN
1.0000 g/kg | INTRAVENOUS | Status: AC
  Administered 2019-01-27 – 2019-01-28 (×2): 100 g via INTRAVENOUS
  Filled 2019-01-27: qty 1000
  Filled 2019-01-27: qty 600

## 2019-01-27 MED ORDER — ACETAMINOPHEN 325 MG PO TABS: 650.0000 mg | ORAL_TABLET | Freq: Four times a day (QID) | ORAL | Status: DC | PRN

## 2019-01-27 MED ORDER — ACETAMINOPHEN 650 MG RE SUPP: 650.0000 mg | Freq: Four times a day (QID) | RECTAL | Status: DC | PRN

## 2019-01-27 MED ORDER — PAROXETINE HCL 20 MG PO TABS
20.0000 mg | ORAL_TABLET | Freq: Every day | ORAL | Status: DC
  Administered 2019-01-27 – 2019-01-29 (×3): 20 mg via ORAL
  Filled 2019-01-27 (×4): qty 1

## 2019-01-27 MED ORDER — DIPHENHYDRAMINE HCL 50 MG/ML IJ SOLN
12.5000 mg | Freq: Every day | INTRAMUSCULAR | Status: AC
  Administered 2019-01-27 – 2019-01-28 (×2): 12.5 mg via INTRAVENOUS
  Filled 2019-01-27 (×2): qty 1

## 2019-01-27 MED ORDER — HYDROCHLOROTHIAZIDE 12.5 MG PO CAPS
12.5000 mg | ORAL_CAPSULE | Freq: Every day | ORAL | Status: DC
  Administered 2019-01-27 – 2019-01-29 (×3): 12.5 mg via ORAL
  Filled 2019-01-27 (×3): qty 1

## 2019-01-27 MED ORDER — ALPRAZOLAM 0.5 MG PO TABS: 0.5000 mg | ORAL_TABLET | Freq: Two times a day (BID) | ORAL | Status: DC | PRN

## 2019-01-27 MED ORDER — ATENOLOL 50 MG PO TABS
25.0000 mg | ORAL_TABLET | Freq: Every day | ORAL | Status: DC
  Administered 2019-01-27 – 2019-01-29 (×3): 25 mg via ORAL
  Filled 2019-01-27 (×3): qty 1

## 2019-01-27 NOTE — Progress Notes (Signed)
VAST consulted to advise time for pharmacy to mix IVIG. Called unit and spoke with pt's RN and advised to have pharmacy mix medicine now; once medication arrives on unit, educated to place an IVT consult. Kami, RN verbalized understanding.

## 2019-01-27 NOTE — Consult Note (Addendum)
[place]  Telephone:([phone]) [phone] Fax:(336) [phone]    [place]  Referring MD: Dr. Eulogio Bear   Reason for Referral: Thrombocytopenia  HPI: Kenneth Juarez is a 58 year old male with a past medical history significant for ITP initially diagnosed in 2008 with recurrence in 2010 successfully treated with IVIG and prednisone, suspected autoimmune hemolytic anemia in 2014 treated with prednisone, depression, hypertension.  He has not been seen by hematology since 2015.  The patient reports that yesterday he noted a petechial rash to his chest and arms.  He presented to an urgent care center and had a CBC and was told that his platelets were low.  He was advised to seek care in the emergency room.  In the emergency room, he was found to have a platelet count of less than 5000.  Other abnormal labs include a sodium of 133, glucose of 183, and creatinine of 1.3.  The emergency room physician spoke with hematology who advised starting on prednisone 80 mg daily.  Patient reports that he has received 2 doses so far.  When seen today, the patient reports that he has been feeling well at home.  Denies any recent viral illness.  Other than his petechial rash, he has not had any symptoms.  He denies fevers and chills.  Denies excessive fatigue and night sweats.  Denies headaches, dizziness, chest discomfort, shortness of breath, cough.  Denies abdominal pain, nausea, vomiting, constipation, diarrhea.  He has not had any leading such as epistaxis, hematemesis, hemoptysis, hematuria, melena, hematochezia.  Hematology was asked see the patient to make recommendations regarding his thrombocytopenia.  Past Medical History:  Diagnosis Date  . Depression   . Hemolytic anemia ([place]) 05/03/2013  . Hypertension   . ITP (idiopathic thrombocytopenic purpura)   :  History reviewed. No pertinent surgical history.:   CURRENT MEDS: Current Facility-Administered Medications   Medication Dose Route Frequency Provider Last Rate Last Dose  . 0.9 %  sodium chloride infusion  250 mL Intravenous PRN Opyd, Ilene Qua, MD      . acetaminophen (TYLENOL) tablet 650 mg  650 mg Oral Q6H PRN Opyd, Ilene Qua, MD       Or  . acetaminophen (TYLENOL) suppository 650 mg  650 mg Rectal Q6H PRN Opyd, Ilene Qua, MD      . ALPRAZolam Duanne Moron) tablet 0.5 mg  0.5 mg Oral BID PRN Opyd, Ilene Qua, MD      . atenolol (TENORMIN) tablet 25 mg  25 mg Oral Daily Opyd, Ilene Qua, MD   25 mg at 01/27/19 1133  . atorvastatin (LIPITOR) tablet 20 mg  20 mg Oral QPM Opyd, Ilene Qua, MD      . lisinopril (ZESTRIL) tablet 10 mg  10 mg Oral Daily Vann, Jessica U, DO   10 mg at 01/27/19 1133   And  . hydrochlorothiazide (MICROZIDE) capsule 12.5 mg  12.5 mg Oral Daily Vann, Jessica U, DO   12.5 mg at 01/27/19 1138  . HYDROcodone-acetaminophen (NORCO/VICODIN) 5-325 MG per tablet 1-2 tablet  1-2 tablet Oral Q4H PRN Opyd, Ilene Qua, MD      . ondansetron (ZOFRAN) tablet 4 mg  4 mg Oral Q6H PRN Opyd, Ilene Qua, MD       Or  . ondansetron (ZOFRAN) injection 4 mg  4 mg Intravenous Q6H PRN Opyd, Ilene Qua, MD      . PARoxetine (PAXIL) tablet 20 mg  20 mg Oral Daily Opyd, Ilene Qua, MD      .  predniSONE (DELTASONE) 20 MG tablet           . predniSONE (DELTASONE) tablet 80 mg  80 mg Oral Daily Opyd, Ilene Qua, MD   80 mg at 01/27/19 1134  . sodium chloride flush (NS) 0.9 % injection 3 mL  3 mL Intravenous Q12H Opyd, Timothy S, MD      . sodium chloride flush (NS) 0.9 % injection 3 mL  3 mL Intravenous PRN Opyd, Ilene Qua, MD          No Known Allergies:  History reviewed. No pertinent family history.:  Social History   Socioeconomic History  . Marital status: Single    Spouse name: Not on file  . Number of children: Not on file  . Years of education: Not on file  . Highest education level: Not on file  Occupational History  . Not on file  Social Needs  . Financial resource strain: Not on file  .  Food insecurity    Worry: Not on file    Inability: Not on file  . Transportation needs    Medical: Not on file    Non-medical: Not on file  Tobacco Use  . Smoking status: Former Smoker    Packs/day: 2.00    Years: 5.00    Pack years: 10.00    Quit date: 06/02/1996    Years since quitting: 22.6  . Smokeless tobacco: Never Used  Substance and Sexual Activity  . Alcohol use: No  . Drug use: No  . Sexual activity: Yes  Lifestyle  . Physical activity    Days per week: Not on file    Minutes per session: Not on file  . Stress: Not on file  Relationships  . Social Herbalist on phone: Not on file    Gets together: Not on file    Attends religious service: Not on file    Active member of club or organization: Not on file    Attends meetings of clubs or organizations: Not on file    Relationship status: Not on file  . Intimate partner violence    Fear of current or ex partner: Not on file    Emotionally abused: Not on file    Physically abused: Not on file    Forced sexual activity: Not on file  Other Topics Concern  . Not on file  Social History Narrative  . Not on file  :  REVIEW OF SYSTEMS: A comprehensive 14 point review of systems was negative except as noted in the HPI.  Exam: Patient Vitals for the past 24 hrs:  BP Temp Temp src Pulse Resp SpO2 Height Weight  01/27/19 0900 118/79 98.8 F (37.1 C) Oral 97 18 96 % - -  01/27/19 0532 123/80 99.1 F (37.3 C) Oral 86 18 98 % - -  01/27/19 0430 109/67 - - 91 - 97 % - -  01/27/19 0355 - 98.9 F (37.2 C) - - - - - -  01/27/19 0345 126/64 - - 81 - 94 % - -  01/27/19 0300 124/78 - - 86 - 94 % - -  01/27/19 0030 (!) 115/96 - - 98 - 96 % - -  01/27/19 0015 129/61 - - 97 - 98 % - -  01/27/19 0000 (!) 146/84 - - 83 - 96 % - -  01/26/19 2345 (!) 149/92 - - 86 - 98 % - -  01/26/19 2330 140/78 - - 87 - 96 % - -  01/26/19 2304 (!) 153/83 - - 95 - 96 % - -  01/26/19 2107 (!) 153/85 99.4 F (37.4 C) Oral 98 16 97 % -  -  01/26/19 2105 - - - - - - 5\' 7"  ([id number] m) [id number] lb (99.3 kg)    General:  well-nourished in no acute distress.   Eyes:  no scleral icterus.   ENT:  There were no oropharyngeal lesions.   Neck was without thyromegaly.   Lymphatics:  Negative cervical, supraclavicular or axillary adenopathy.   Respiratory: lungs were clear bilaterally without wheezing or crackles.   Cardiovascular:  Regular rate and rhythm, S1/S2, without murmur, rub or gallop.  There was no pedal edema.   GI:  abdomen was soft, flat, nontender, nondistended, without organomegaly.  Musculoskeletal:  no spinal tenderness of palpation of vertebral spine.   Skin: Petechiae noted to his bilateral arms, legs, and upper chest. Neuro exam was nonfocal.  Patient was alert and oriented.  Attention was good.   Language was appropriate.  Mood was normal without depression.  Speech was not pressured.  Thought content was not tangential.    LABS:  Lab Results  Component Value Date   WBC 6.7 01/27/2019   HGB 15.0 01/27/2019   HCT 44.3 01/27/2019   PLT <5 (LL) 01/27/2019   GLUCOSE 133 (H) 01/27/2019   ALT 26 01/26/2019   AST 23 01/26/2019   NA 134 (L) 01/27/2019   K 4.1 01/27/2019   CL 101 01/27/2019   CREATININE 1.15 01/27/2019   BUN 10 01/27/2019   CO2 23 01/27/2019   INR 1.0 01/26/2019    No results found.  ASSESSMENT AND PLAN:  1.  Thrombocytopenia 2.  History of ITP x2 in 2008 and again 2010.  Successfully treated with IVIG and prednisone. 3.  History of autoimmune hemolytic anemia in 2014.  Successfully treated with prednisone.  -Discussed lab results to date with the patient.  We will review peripheral blood smear but suspect this is recurrent ITP. -Recommend continuation on prednisone 80 mg daily.  May consider adding IVIG after review of peripheral smear. -Agree with holding off on platelet transfusion unless the patient develops active bleeding. -Recommend daily CBC close monitoring of platelets.  Thank you  for this referral.  Mikey Bussing, DNP, AGPCNP-BC, AOCNP  Attending Note  I personally saw the patient, reviewed the chart and examined the patient. The plan of care was discussed with the patient. I agree with the assessment and plan as documented above. Thank you very much for the consultation.  1.  Acute ITP: Petechiae and platelet count less than 5.  He had 2 episodes of ITP in the past last one was in 2015.  Prednisone 50 mg was started yesterday and IVIG will be started today. Unusually immature platelet fraction is low which suggests decreased bone marrow production.  This could also indicate that his recovery may be slower than usual.  2.  DIC panel is negative and blood smear does not show any schistocytes. Patient understands risks and benefits of IVIG and is willing to proceed. I will see him daily and monitor his blood platelet levels.

## 2019-01-27 NOTE — ED Provider Notes (Addendum)
[facility] EMERGENCY DEPARTMENT Provider Note   CSN: [id number] Arrival date & time: 01/26/19  2035     History   Chief Complaint Chief Complaint  Patient presents with  . Abnormal Lab    HPI Kenneth Juarez is a 58 y.o. male with recurrent ITP in 2008 and 2010, autoimmune hemolytic anemia in 2014, HTN who presents to the emergency department with a chief complaint of petechial rash.  The patient reports that he noticed a petechial rash at approximately 630 AM after getting out of the shower.  He reports that he went to his primary care provider where he was found to have a platelet count of less than 1000 and was advised to come to the ER.  No spontaneous bleeding from his gums, epistaxis, melena, hematochezia, fatigue, headache, shortness of breath, abdominal pain  He has not had a splenectomy.  He is established with Dr. Alvy Bimler, but has not been seen since 2015.  He has not been on corticosteroids for several years.   The history is provided by the patient. No language interpreter was used.      The history is provided by the patient. No language interpreter was used.    Past Medical History:  Diagnosis Date  . Depression   . Hemolytic anemia ([place]) 05/03/2013  . Hypertension   . ITP (idiopathic thrombocytopenic purpura)     Patient Active Problem List   Diagnosis Date Noted  . Depression with anxiety   . Essential hypertension   . Thrombocytopenia ([place]) 05/03/2013  . Hemolytic anemia ([place]) 05/03/2013    History reviewed. No pertinent surgical history.      Home Medications    Prior to Admission medications   Medication Sig Start Date End Date Taking? Authorizing Provider  ALPRAZolam Duanne Moron) 0.5 MG tablet Take 0.5 mg by mouth 2 (two) times daily as needed for anxiety. 08/05/18  Yes [provider]  atenolol (TENORMIN) 25 MG tablet Take 25 mg by mouth daily.   Yes [provider]  atorvastatin (LIPITOR) 10 MG tablet  Take 20 mg by mouth every evening.    Yes [provider]  cholecalciferol (VITAMIN D3) 25 MCG (1000 UT) tablet Take 1,000 Units by mouth daily.   Yes [provider]  lisinopril-hydrochlorothiazide (PRINZIDE,ZESTORETIC) 10-12.5 MG per tablet Take 1 tablet by mouth daily.   Yes [provider]  PARoxetine (PAXIL) 20 MG tablet Take 20 mg by mouth daily.   Yes [provider]    Family History History reviewed. No pertinent family history.  Social History Social History   Tobacco Use  . Smoking status: Former Smoker    Packs/day: 2.00    Years: 5.00    Pack years: 10.00    Quit date: 06/02/1996    Years since quitting: 22.6  . Smokeless tobacco: Never Used  Substance Use Topics  . Alcohol use: No  . Drug use: No     Allergies   Patient has no known allergies.   Review of Systems Review of Systems  Constitutional: Negative for appetite change, diaphoresis, fatigue and fever.  HENT: Negative for congestion and sore throat.   Eyes: Negative for visual disturbance.  Respiratory: Negative for shortness of breath.   Cardiovascular: Negative for chest pain.  Gastrointestinal: Negative for abdominal pain, diarrhea, nausea and vomiting.  Genitourinary: Negative for dysuria and hematuria.  Musculoskeletal: Negative for back pain.  Skin: Positive for rash.  Allergic/Immunologic: Negative for immunocompromised state.  Neurological: Negative for dizziness,  seizures, weakness, numbness and headaches.  Hematological: Bruises/bleeds easily.  Psychiatric/Behavioral: Negative for confusion.   Physical Exam Updated Vital Signs BP 109/67   Pulse 91   Temp 98.9 F (37.2 C)   Resp 16   Ht 5\' 7"  (1.702 m)   Wt 99.3 kg   SpO2 97%   BMI 34.30 kg/m   Physical Exam Vitals signs and nursing note reviewed.  Constitutional:      General: He is not in acute distress.    Appearance: He is well-developed. He is not ill-appearing, toxic-appearing or  diaphoretic.  HENT:     Head: Normocephalic.  Eyes:     Conjunctiva/sclera: Conjunctivae normal.  Neck:     Musculoskeletal: Neck supple.  Cardiovascular:     Rate and Rhythm: Normal rate and regular rhythm.     Heart sounds: No murmur.  Pulmonary:     Effort: Pulmonary effort is normal. No respiratory distress.     Breath sounds: No stridor. No wheezing, rhonchi or rales.  Chest:     Chest wall: No tenderness.  Abdominal:     General: There is no distension.     Palpations: Abdomen is soft.     Tenderness: There is no abdominal tenderness. There is no right CVA tenderness, left CVA tenderness, guarding or rebound.     Hernia: No hernia is present.     Comments: Abdomen is obese, but soft and non-tender.  Skin:    General: Skin is warm and dry.     Comments: Petechial rash noted over the trunk, bilateral upper and lower extremities.  He  Neurological:     Mental Status: He is alert.  Psychiatric:        Behavior: Behavior normal.    ED Treatments / Results  Labs (all labs ordered are listed, but only abnormal results are displayed) Labs Reviewed  CBC WITH DIFFERENTIAL/PLATELET - Abnormal; Notable for the following components:      Result Value   Platelets <5 (*)    All other components within normal limits  COMPREHENSIVE METABOLIC PANEL - Abnormal; Notable for the following components:   Sodium 133 (*)    Glucose, Bld 183 (*)    Creatinine, Ser 1.30 (*)    Total Protein 6.3 (*)    All other components within normal limits  SARS CORONAVIRUS 2  APTT  PROTIME-INR  SAVE SMEAR (SSMR)  SAMPLE TO BLOOD BANK  TYPE AND SCREEN    EKG None  Radiology No results found.  Procedures .Critical Care Performed by: Joanne Gavel, PA-C Authorized by: Joanne Gavel, PA-C   Critical care provider statement:    Critical care time (minutes):  35   Critical care time was exclusive of:  Separately billable procedures and treating other patients and teaching time    Critical care was necessary to treat or prevent imminent or life-threatening deterioration of the following conditions:  Circulatory failure   Critical care was time spent personally by me on the following activities:  Ordering and performing treatments and interventions, re-evaluation of patient's condition, ordering and review of radiographic studies, ordering and review of laboratory studies, obtaining history from patient or surrogate, examination of patient, evaluation of patient's response to treatment, review of old charts, development of treatment plan with patient or surrogate and discussions with consultants   (including critical care time)  Medications Ordered in ED Medications  predniSONE (DELTASONE) 20 MG tablet (has no administration in time range)     Initial Impression / Assessment  and Plan / ED Course  I have reviewed the triage vital signs and the nursing notes.  Pertinent labs & imaging results that were available during my care of the patient were reviewed by me and considered in my medical decision making (see chart for details).        58 year old male with recurrent ITP in 2008 and 2010, autoimmune hemolytic anemia in 2014, HTN presenting with petechial rash, onset today.  He was seen by primary care and was found to have thrombocytopenia of less than 1000. The patient was seen and independently evaluated by Dr. Leonette Monarch, attending physician.  Vitals have been stable since arrival in the ER.  Hemoglobin is stable at 15.2.  PTT is 24.  PT is 12.9 and INR is 1.0.  COVID-19 test has been ordered.  Concern for recurrent ITP.  Consulted hematology oncology and spoke with Dr. Lindi Adie who recommends starting the patient on 80 mg of oral prednisone tonight, which has been ordered.  No other recommendations at this time.  Heme-onc consult is appreciated.  Consulted the hospitalist team and Dr. Myna Hidalgo will admit. The patient appears reasonably stabilized for admission considering the  current resources, flow, and capabilities available in the ED at this time, and I doubt any other [facility] requiring further screening and/or treatment in the ED prior to admission.   Final Clinical Impressions(s) / ED Diagnoses   Final diagnoses:  Thrombocytopenia [facility])    ED Discharge Orders    None       Joanne Gavel, PA-C 01/27/19 0334    Joanne Gavel, PA-C 01/27/19 [id number]    Fatima Blank, MD 01/31/19 1524

## 2019-01-27 NOTE — ED Notes (Signed)
ED TO INPATIENT HANDOFF REPORT  ED Nurse Name and Phone #: Ophelia Charter RN [phone]  S Name/Age/Gender Kenneth Juarez 58 y.o. male Room/Bed: 023C/023C  Code Status   Code Status: Not on file  Home/SNF/Other Home Patient oriented to: self, place, time and situation Is this baseline? Yes   Triage Complete: Triage complete  Chief Complaint low platelets  Triage Note Pt arrives POV for low platelets, states under 1000. Reports he noted petechial rash this AM from shower and had platelets checked at PCP. Report hx of ITP in 2010, states this is similar. Pt denies symptoms     Allergies No Known Allergies  Level of Care/Admitting Diagnosis ED Disposition    ED Disposition Condition Comment   Admit  Hospital Area: [facility] [[id number]]  Level of Care: Med-Surg [16]  I expect the patient will be discharged within 24 hours: No (not a candidate for 5C-Observation unit)  Covid Evaluation: Asymptomatic Screening Protocol (No Symptoms)  Diagnosis: Thrombocytopenia [facility]) [id number]  Admitting Physician: Vianne Bulls [id number]  Attending Physician: Vianne Bulls [id number]  PT Class (Do Not Modify): Observation [104]  PT Acc Code (Do Not Modify): Observation [10022]       B Medical/Surgery History Past Medical History:  Diagnosis Date  . Depression   . Hemolytic anemia ([place]) 05/03/2013  . Hypertension   . ITP (idiopathic thrombocytopenic purpura)    History reviewed. No pertinent surgical history.   A IV Location/Drains/Wounds Patient Lines/Drains/Airways Status   Active Line/Drains/Airways    Name:   Placement date:   Placement time:   Site:   Days:   Peripheral IV 01/27/19 Left Antecubital   01/27/19    0200    Antecubital   less than 1          Intake/Output Last 24 hours No intake or output data in the 24 hours ending 01/27/19 0439  Labs/Imaging Results for orders placed or performed during the hospital encounter of 01/26/19 (from the  past 48 hour(s))  Sample to Blood Bank     Status: None   Collection Time: 01/26/19  9:01 PM  Result Value Ref Range   Blood Bank Specimen SAMPLE AVAILABLE FOR TESTING    Sample Expiration      01/27/2019,2359 Performed at [facility] [address] [place], [place] [address]   Type and screen [place]     Status: None   Collection Time: 01/26/19  9:01 PM  Result Value Ref Range   ABO/RH(D) A POS    Antibody Screen NEG    Sample Expiration      01/29/2019,2359 Performed at [facility], [place] [address], [place], [place] [address]   CBC with Differential     Status: Abnormal   Collection Time: 01/26/19  9:08 PM  Result Value Ref Range   WBC 8.4 4.0 - 10.5 K/uL   RBC 5.23 4.22 - 5.81 MIL/uL   Hemoglobin 15.2 13.0 - 17.0 g/dL   HCT 45.7 39.0 - 52.0 %   MCV 87.4 80.0 - 100.0 fL   MCH 29.1 26.0 - 34.0 pg   MCHC 33.3 30.0 - 36.0 g/dL   RDW 12.6 11.5 - 15.5 %   Platelets <5 (LL) 150 - 400 K/uL    Comment: REPEATED TO VERIFY PLATELET COUNT CONFIRMED BY SMEAR Immature Platelet Fraction may be clinically indicated, consider ordering this additional test [id number] CRITICAL RESULT CALLED TO, READ BACK BY AND VERIFIED WITH: CHILPON L,  RN AT 2148 ON 01/26/2019 BY SAINVILUS S    nRBC 0.0 0.0 - 0.2 %   Neutrophils Relative % 68 %   Neutro Abs 5.6 1.7 - 7.7 K/uL   Lymphocytes Relative 19 %   Lymphs Abs 1.6 0.7 - 4.0 K/uL   Monocytes Relative 9 %   Monocytes Absolute 0.8 0.1 - 1.0 K/uL   Eosinophils Relative 3 %   Eosinophils Absolute 0.3 0.0 - 0.5 K/uL   Basophils Relative 1 %   Basophils Absolute 0.1 0.0 - 0.1 K/uL   Smear Review MORPHOLOGY UNREMARKABLE    Immature Granulocytes 0 %   Abs Immature Granulocytes 0.03 0.00 - 0.07 K/uL    Comment: Performed at [facility] [address], [place], [place] [address]  Comprehensive metabolic panel     Status: Abnormal   Collection Time: 01/26/19  9:08 PM  Result Value Ref Range   Sodium 133 (L)  135 - 145 mmol/L   Potassium 3.6 3.5 - 5.1 mmol/L   Chloride 100 98 - 111 mmol/L   CO2 22 22 - 32 mmol/L   Glucose, Bld 183 (H) 70 - 99 mg/dL   BUN 14 6 - 20 mg/dL   Creatinine, Ser 1.30 (H) 0.61 - 1.24 mg/dL   Calcium 9.1 8.9 - 10.3 mg/dL   Total Protein 6.3 (L) 6.5 - 8.1 g/dL   Albumin 3.8 3.5 - 5.0 g/dL   AST 23 15 - 41 U/L   ALT 26 0 - 44 U/L   Alkaline Phosphatase 72 38 - 126 U/L   Total Bilirubin 1.0 0.3 - 1.2 mg/dL   GFR calc non Af Amer >60 >60 mL/min   GFR calc Af Amer >60 >60 mL/min   Anion gap 11 5 - 15    Comment: Performed at [facility] [address] [place], [place] [address]  APTT     Status: None   Collection Time: 01/26/19 11:51 PM  Result Value Ref Range   aPTT 24 24 - 36 seconds    Comment: Performed at [facility] [address], [place], [place] [address]  Protime-INR     Status: None   Collection Time: 01/26/19 11:51 PM  Result Value Ref Range   Prothrombin Time 12.9 11.4 - 15.2 seconds   INR 1.0 0.8 - 1.2    Comment: (NOTE) INR goal varies based on device and disease states. Performed at [facility], [place] [address] [place], [place] [address]    No results found.  Pending Labs Unresulted Labs (From admission, onward)    Start     Ordered   01/27/19 0319  Save Smear  Once,   STAT     01/27/19 0318   01/27/19 0230  SARS CORONAVIRUS 2 (TAT 6-12 HRS)  Once,   R     01/27/19 0230   Signed and Held  HIV antibody (Routine Testing)  Tomorrow morning,   R     Signed and Held   Signed and Held  Basic metabolic panel  Tomorrow morning,   R     Signed and Held   Signed and Held  CBC WITH DIFFERENTIAL  Tomorrow morning,   R     Signed and Held   Signed and Held  HCV Ab Reflex to Quant PCR  Tomorrow morning,   R     Signed and Held          Vitals/Pain Today's Vitals   01/27/19  0030 01/27/19 0044 01/27/19 0330 01/27/19 0355  BP: (!) 115/96     Pulse: 98     Resp:      Temp:    98.9 F (37.2 C)  TempSrc:      SpO2: 96%      Weight:      Height:      PainSc:  0-No pain 0-No pain     Isolation Precautions No active isolations  Medications Medications  predniSONE (DELTASONE) 20 MG tablet (has no administration in time range)    Mobility walks Low fall risk   Focused Assessments    R Recommendations: See Admitting Provider Note  Report given to:   Additional Notes:

## 2019-01-27 NOTE — ED Provider Notes (Signed)
Attestation: Medical screening examination/treatment/procedure(s) were conducted as a shared visit with non-physician practitioner(s) and myself.  I personally evaluated the patient during the encounter.  Briefly, the patient is a 58 y.o. male with h/o hemolytic anemia and ITP, here for petechia and thrombocytopenia noted in clinic.   Vitals:   01/27/19 0355 01/27/19 0430  BP:  109/67  Pulse:  91  Resp:    Temp: 98.9 F (37.2 C)   SpO2:  97%    CONSTITUTIONAL:  well-appearing, NAD NEURO:  Alert and oriented x 3, no focal deficits EYES:  pupils equal and reactive ENT/NECK:  trachea midline, no JVD CARDIO:  nl rate, reg rhythm, well-perfused PULM:  non-labored breathing GI/GU:  Abdomin non-distended MSK/SPINE:  No gross deformities, no edema SKIN:  petechia to BUE, BLE and [place] PSYCH:  Appropriate speech and behavior  Work up notable for significant thrombocytopenia. Hematology consulted and requested steroids.  Admitted for further management.  .Critical Care Performed by: Fatima Blank, MD Authorized by: Fatima Blank, MD      CRITICAL CARE Performed by: Grayce Sessions Cardama Total critical care time: 20 minutes Critical care time was exclusive of separately billable procedures and treating other patients. Critical care was necessary to treat or prevent imminent or life-threatening deterioration. Critical care was time spent personally by me on the following activities: development of treatment plan with patient and/or surrogate as well as nursing, discussions with consultants, evaluation of patient's response to treatment, examination of patient, obtaining history from patient or surrogate, ordering and performing treatments and interventions, ordering and review of laboratory studies, ordering and review of radiographic studies, pulse oximetry and re-evaluation of patient's condition.      EKG:              Fatima Blank,  MD 01/27/19 [phone]

## 2019-01-27 NOTE — ED Notes (Signed)
Ambulated to bathroom

## 2019-01-27 NOTE — H&P (Signed)
History and Physical    Kenneth Juarez [id number] DOB: 11/27/1960 DOA: 01/26/2019  PCP: [name], MD   Patient coming from: Home   Chief Complaint: Rash, low platelets   HPI: Kenneth Juarez is a 58 y.o. male with medical history significant for depression, anxiety, hypertension, ITP x2, and suspected autoimmune hemolytic anemia in 2014, now presenting to the emergency department for evaluation of petechial rash and low platelets on outpatient blood work.  Patient reports that he been in his usual state of health when he noticed petechiae about his upper and lower extremities the morning of 01/26/2019.  He was evaluated by his PCP for this and outpatient blood work was obtained, concerning for platelets of less than at thousand.  He was directed to the ED for further evaluation of this.  The patient denies any recent fevers, chills, cough, rhinorrhea, sore throat, shortness of breath, abdominal pain, vomiting, diarrhea, bleeding, or bruising.  He denies using any new medications recently.  He does not have any pain anywhere and, aside from the rash, feels like his usual self.  Historically, the patient had thrombocytopenia in 2008 that was ultimately attributed to ITP and resolved with IVIG and prednisone.  He had a recurrence of ITP in 2010, again resolving with IVIG and prednisone.  He developed anemia in 2014, was suspected to have autoimmune hemolytic anemia, and this also resolved with prednisone.  He has been off of prednisone for several years.  ED Course: Upon arrival to the ED, patient is found to be afebrile, saturating well on room air, and remaining vitals also normal.  Chemistry panel features a sodium of 133 with glucose 183 and creatinine of 1.30, similar to remote prior.  CBC is concerning for platelets of "<5000."  COVID-19 screening test is in process.  Hematology was consulted by the ED physician, recommended starting prednisone 80 mg, and indicated that they would see  the patient in the hospital and provide further recommendations.  Review of Systems:  All other systems reviewed and apart from HPI, are negative.  Past Medical History:  Diagnosis Date  . Depression   . Hemolytic anemia ([place]) 05/03/2013  . Hypertension   . ITP (idiopathic thrombocytopenic purpura)     History reviewed. No pertinent surgical history.   reports that he quit smoking about 22 years ago. He has a 10.00 pack-year smoking history. He has never used smokeless tobacco. He reports that he does not drink alcohol or use drugs.  No Known Allergies  History reviewed. No pertinent family history.   Prior to Admission medications   Medication Sig Start Date End Date Taking? Authorizing Provider  ALPRAZolam Duanne Moron) 0.5 MG tablet Take 0.5 mg by mouth 2 (two) times daily as needed for anxiety. 08/05/18  Yes [provider]  atenolol (TENORMIN) 25 MG tablet Take 25 mg by mouth daily.   Yes [provider]  atorvastatin (LIPITOR) 10 MG tablet Take 20 mg by mouth every evening.    Yes [provider]  cholecalciferol (VITAMIN D3) 25 MCG (1000 UT) tablet Take 1,000 Units by mouth daily.   Yes [provider]  lisinopril-hydrochlorothiazide (PRINZIDE,ZESTORETIC) 10-12.5 MG per tablet Take 1 tablet by mouth daily.   Yes [provider]  PARoxetine (PAXIL) 20 MG tablet Take 20 mg by mouth daily.   Yes [provider]    Physical Exam: Vitals:   01/26/19 2345 01/27/19 0000 01/27/19 0015 01/27/19 0030  BP: (!) 149/92 (!) 146/84 129/61 (!) 115/96  Pulse: 86 83 97 98  Resp:      Temp:      TempSrc:      SpO2: 98% 96% 98% 96%  Weight:      Height:        Constitutional: NAD, calm  Eyes: PERTLA, lids and conjunctivae normal ENMT: Mucous membranes are moist. Posterior pharynx clear of any exudate or lesions.   Neck: normal, supple, no masses, no thyromegaly Respiratory: no wheezing, no crackles. Normal respiratory effort. No  accessory muscle use.  Cardiovascular: S1 & S2 heard, regular rate and rhythm. No extremity edema.   Abdomen: No distension, no tenderness, soft. Bowel sounds active.  Musculoskeletal: no clubbing / cyanosis. No joint deformity upper and lower extremities.   Skin: petechiae, mainly about the extremities. Warm, dry, well-perfused. Neurologic: CN 2-12 grossly intact. Sensation intact. Strength 5/5 in all 4 limbs.  Psychiatric: Alert and oriented x 3. Very pleasant, cooperative.    Labs on Admission: I have personally reviewed following labs and imaging studies  CBC: Recent Labs  Lab 01/26/19 2108  WBC 8.4  NEUTROABS 5.6  HGB 15.2  HCT 45.7  MCV 87.4  PLT <5*   Basic Metabolic Panel: Recent Labs  Lab 01/26/19 2108  NA 133*  K 3.6  CL 100  CO2 22  GLUCOSE 183*  BUN 14  CREATININE 1.30*  CALCIUM 9.1   GFR: Estimated Creatinine Clearance: 69.6 mL/min (A) (by C-G formula based on SCr of 1.3 mg/dL (H)). Liver Function Tests: Recent Labs  Lab 01/26/19 2108  AST 23  ALT 26  ALKPHOS 72  BILITOT 1.0  PROT 6.3*  ALBUMIN 3.8   No results for input(s): LIPASE, AMYLASE in the last 168 hours. No results for input(s): AMMONIA in the last 168 hours. Coagulation Profile: Recent Labs  Lab 01/26/19 2351  INR 1.0   Cardiac Enzymes: No results for input(s): CKTOTAL, CKMB, CKMBINDEX, TROPONINI in the last 168 hours. BNP (last 3 results) No results for input(s): PROBNP in the last 8760 hours. HbA1C: No results for input(s): HGBA1C in the last 72 hours. CBG: No results for input(s): GLUCAP in the last 168 hours. Lipid Profile: No results for input(s): CHOL, HDL, LDLCALC, TRIG, CHOLHDL, LDLDIRECT in the last 72 hours. Thyroid Function Tests: No results for input(s): TSH, T4TOTAL, FREET4, T3FREE, THYROIDAB in the last 72 hours. Anemia Panel: No results for input(s): VITAMINB12, FOLATE, FERRITIN, TIBC, IRON, RETICCTPCT in the last 72 hours. Urine analysis: No results found  for: COLORURINE, APPEARANCEUR, LABSPEC, PHURINE, GLUCOSEU, HGBUR, BILIRUBINUR, KETONESUR, PROTEINUR, UROBILINOGEN, NITRITE, LEUKOCYTESUR Sepsis Labs: @LABRCNTIP (procalcitonin:4,lacticidven:4) )No results found for this or any previous visit (from the past 240 hour(s)).   Radiological Exams on Admission: No results found.  EKG: Not performed.   Assessment/Plan   1. Thrombocytopenia  - Presents with one day of petechial rash, found to have severe isolated thrombocytopenia  - Patient denies any recent illness or new medications, denies any bleeding or bruising  - He has hx of ITP in 2008 and 2010, and autoimmune hemolytic anemia in 2014  - Likely recurrent ITP  - Hematology consulting and much appreciated, recommended starting prednisone 80 mg daily  - Start prednisone 80 mg , save smear, check HIV and HCV    2. Hypertension  - BP at goal, continue atenolol, lisinopril-HCTZ    3. Depression, anxiety  - Continue Paxil and as-needed Xanax    PPE: Mask, face shield. Patient wearing mask.  DVT prophylaxis: SCD's  Code Status: Full  Family  Communication: Discussed with patient  Consults called: Hematology consulted by ED physician  Admission status: Observation     Vianne Bulls, MD [facility] Pager [phone]  If 7PM-7AM, please contact night-coverage [email or web address] Password [facility]  01/27/2019, 3:19 AM

## 2019-01-27 NOTE — Progress Notes (Signed)
New Admission Note:   Arrival Method: stretcher Mental Orientation: A&Ox4 Telemetry: none Assessment: Completed Skin: petechiae on chest,abd,bil arms,bil lower extremities,back [place]:[id number] AC NSL Pain: none  Tubes: none Safety Measures: Safety Fall Prevention Plan has been discussed Admission: Completed [facility] Orientation: Patient has been orientated to the room, unit and staff.  Family: none  Orders have been reviewed and implemented. Will continue to monitor the patient. Call light has been placed within reach and bed alarm has been activated.   Rockie Neighbours BSN, RN Phone number: [phone]

## 2019-01-27 NOTE — Progress Notes (Signed)
Patient admitted by Dr. Myna Hidalgo after midnight, but appears care began before midnight.  Here with petecheal rash and low plts.  History of ITP in past- -has seen Dr. Alvy Bimler.  Hematology consult appreciated.  Prednisone started at 80 mg per recommendations from last PM.  Rash seems to be slowed in progression per patient but labs show that plts are still <5.  No hematuria or blood in stool.  Denies new medications.  Eulogio Bear DO

## 2019-01-28 LAB — BASIC METABOLIC PANEL
Anion gap: 8 (ref 5–15)
BUN: 20 mg/dL (ref 6–20)
CO2: 23 mmol/L (ref 22–32)
Calcium: 8.6 mg/dL — ABNORMAL LOW (ref 8.9–10.3)
Chloride: 101 mmol/L (ref 98–111)
Creatinine, Ser: 1.2 mg/dL (ref 0.61–1.24)
GFR calc Af Amer: >60 mL/min (ref >60)
GFR calc non Af Amer: >60 mL/min (ref >60)
Glucose, Bld: 93 mg/dL (ref 70–99)
Potassium: 3.6 mmol/L (ref 3.5–5.1)
Sodium: 132 mmol/L — ABNORMAL LOW (ref 135–145)

## 2019-01-28 LAB — HCV INTERPRETATION

## 2019-01-28 LAB — CBC
HCT: 38.7 % — ABNORMAL LOW (ref 39.0–52.0)
Hemoglobin: 13 g/dL (ref 13.0–17.0)
MCH: 29.5 pg (ref 26.0–34.0)
MCHC: 33.6 g/dL (ref 30.0–36.0)
MCV: 87.8 fL (ref 80.0–100.0)
Platelets: 6 10*3/uL — CL (ref 150–400)
RBC: 4.41 MIL/uL (ref 4.22–5.81)
RDW: 12.7 % (ref 11.5–15.5)
WBC: 8.2 10*3/uL (ref 4.0–10.5)
nRBC: 0 % (ref 0.0–0.2)

## 2019-01-28 LAB — HCV AB W REFLEX TO QUANT PCR: HCV Ab: <0.1 s/co ratio (ref 0.0–0.9)

## 2019-01-28 NOTE — Plan of Care (Signed)
  Problem: Clinical Measurements: Goal: Respiratory complications will improve Outcome: Completed/Met Goal: Cardiovascular complication will be avoided Outcome: Completed/Met

## 2019-01-28 NOTE — Progress Notes (Signed)
PROGRESS NOTE    Kenneth Juarez  [id number] DOB: 10-07-1960 DOA: 01/26/2019 PCP: [name], MD   Brief Narrative:  Patient is a 58 year old male with history of depression, anxiety, hypertension, ITP, suspected autoimmune hemolytic anemia who presents the emergency department for the evaluation of petechial rash, low platelets on outpatient blood work.  Patient admitted for the management of ITP.  Hematology following.  Currently on prednisone and IVIG.  Assessment & Plan:   Principal Problem:   Thrombocytopenia ([place]) Active Problems:   Depression with anxiety   Essential hypertension   Acute ITP ([facility])   ITP: Known history of ITP.  Presented with very low platelets, petechial rash.  Hematology consulted and following.  Started on prednisone and IVIG.  Platelets today is 6000.  He denies any bleeding or bruising.  No frank petechial rashes seen today.  History of ITP in 2008, 2010 and autoimmune hemolytic anemia in 2014.  DIC panel negative.  Hypertension: Currently blood stable.  Continue atenolol, lisinopril-hydrochlorothiazide  Depression/anxiety: Continue Paxil, as needed Xanax          DVT prophylaxis: SCD Code Status: Full Family Communication: None present at the bedside Disposition Plan: Home after improvement in the platelet level   Consultants: Hematology  Procedures: None  Antimicrobials:  Anti-infectives (From admission, onward)   None      Subjective:  Patient seen and examined the bedside this morning.  Hemodynamically stable.  Comfortable.  No complaints.  No new rashes or bruises.  Objective: Vitals:   01/28/19 0034 01/28/19 0104 01/28/19 0534 01/28/19 0857  BP: (!) 110/58 (!) 102/58 (!) 103/55 123/66  Pulse: 68 64 85 94  Resp: 16 16 18 18   Temp: 98.3 F (36.8 C) 98.8 F (37.1 C) 98.4 F (36.9 C) 99.1 F (37.3 C)  TempSrc: Oral Oral Oral Oral  SpO2: 96% 96% 97% 95%  Weight:      Height:        Intake/Output Summary (Last  24 hours) at 01/28/2019 1313 Last data filed at 01/28/2019 1000 Gross per 24 hour  Intake 483 ml  Output 2150 ml  Net -1667 ml   Filed Weights   01/26/19 2105  Weight: 99.3 kg    Examination:  General exam: Appears calm and comfortable ,Not in distress, obese HEENT:PERRL,Oral mucosa moist, Ear/Nose normal on gross exam Respiratory system: Bilateral equal air entry, normal vesicular breath sounds, no wheezes or crackles  Cardiovascular system: S1 & S2 heard, RRR. No JVD, murmurs, rubs, gallops or clicks. No pedal edema. Gastrointestinal system: Abdomen is nondistended, soft and nontender. No organomegaly or masses felt. Normal bowel sounds heard. Central nervous system: Alert and oriented. No focal neurological deficits. Extremities: No edema, no clubbing ,no cyanosis, distal peripheral pulses palpable. Skin: No rashes, lesions or ulcers,no icterus ,no pallor MSK: Normal muscle bulk,tone ,power Psychiatry: Judgement and insight appear normal. Mood & affect appropriate.     Data Reviewed: I have personally reviewed following labs and imaging studies  CBC: Recent Labs  Lab 01/26/19 2108 01/27/19 0721 01/27/19 1158 01/28/19 0332  WBC 8.4 6.7  --  8.2  NEUTROABS 5.6 5.8  --   --   HGB 15.2 15.0  --  13.0  HCT 45.7 44.3  --  38.7*  MCV 87.4 85.7  --  87.8  PLT <5* <5* <5* 6*   Basic Metabolic Panel: Recent Labs  Lab 01/26/19 2108 01/27/19 0721 01/28/19 0332  NA 133* 134* 132*  K 3.6 4.1 3.6  CL 100  101 101  CO2 22 23 23   GLUCOSE 183* 133* 93  BUN 14 10 20   CREATININE 1.30* 1.15 1.20  CALCIUM 9.1 9.3 8.6*   GFR: Estimated Creatinine Clearance: 75.4 mL/min (by C-G formula based on SCr of 1.2 mg/dL). Liver Function Tests: Recent Labs  Lab 01/26/19 2108  AST 23  ALT 26  ALKPHOS 72  BILITOT 1.0  PROT 6.3*  ALBUMIN 3.8   No results for input(s): LIPASE, AMYLASE in the last 168 hours. No results for input(s): AMMONIA in the last 168 hours. Coagulation Profile:  Recent Labs  Lab 01/26/19 2351 01/27/19 1158  INR 1.0 1.1   Cardiac Enzymes: No results for input(s): CKTOTAL, CKMB, CKMBINDEX, TROPONINI in the last 168 hours. BNP (last 3 results) No results for input(s): PROBNP in the last 8760 hours. HbA1C: No results for input(s): HGBA1C in the last 72 hours. CBG: No results for input(s): GLUCAP in the last 168 hours. Lipid Profile: No results for input(s): CHOL, HDL, LDLCALC, TRIG, CHOLHDL, LDLDIRECT in the last 72 hours. Thyroid Function Tests: No results for input(s): TSH, T4TOTAL, FREET4, T3FREE, THYROIDAB in the last 72 hours. Anemia Panel: Recent Labs    01/27/19 1158  RETICCTPCT 1.7   Sepsis Labs: No results for input(s): PROCALCITON, LATICACIDVEN in the last 168 hours.  Recent Results (from the past 240 hour(s))  SARS CORONAVIRUS 2 (TAT 6-12 HRS)     Status: None   Collection Time: 01/27/19  2:30 AM  Result Value Ref Range Status   SARS Coronavirus 2 NEGATIVE NEGATIVE Final    Comment: (NOTE) SARS-CoV-2 target nucleic acids are NOT DETECTED. The SARS-CoV-2 RNA is generally detectable in upper and lower respiratory specimens during the acute phase of infection. Negative results do not preclude SARS-CoV-2 infection, do not rule out co-infections with other pathogens, and should not be used as the sole basis for treatment or other patient management decisions. Negative results must be combined with clinical observations, patient history, and epidemiological information. The expected result is Negative. Fact Sheet for Patients: SugarRoll.be Fact Sheet for Healthcare Providers: https://www.woods-mathews.com/ This test is not yet approved or cleared by the [place] FDA and  has been authorized for detection and/or diagnosis of SARS-CoV-2 by FDA under an Emergency Use Authorization (EUA). This EUA will remain  in effect (meaning this test can be used) for the duration of the  COVID-19 declaration under Section 56 4(b)(1) of the Act, 21 U.S.C. section 360bbb-3(b)(1), unless the authorization is terminated or revoked sooner. Performed at [facility], [place] [address] [place], [place] [address]          Radiology Studies: No results found.      Scheduled Meds: . atenolol  25 mg Oral Daily  . atorvastatin  20 mg Oral QPM  . diphenhydrAMINE  12.5 mg Intravenous Daily  . lisinopril  10 mg Oral Daily   And  . hydrochlorothiazide  12.5 mg Oral Daily  . PARoxetine  20 mg Oral Daily  . predniSONE  80 mg Oral Daily  . sodium chloride flush  3 mL Intravenous Q12H   Continuous Infusions: . sodium chloride    . IMMUNE GLOBULIN 10% (HUMAN) IV - For Fluid Restriction Only       LOS: 1 day    Time spent: 35 mins.More than 50% of that time was spent in counseling and/or coordination of care.      Shelly Coss, MD [facility] Pager [phone]  If 7PM-7AM, please contact night-coverage [email or web address] Password  [email or web address] 01/28/2019, 1:13 PM

## 2019-01-28 NOTE — Progress Notes (Signed)
HEMATOLOGY-ONCOLOGY PROGRESS NOTE  SUBJECTIVE: Patient tolerated IVIG yesterday very well.  Today will be the second dose of IVIG. He reported no new symptoms of bruising or bleeding.  Petechiae are slightly better.  OBJECTIVE: REVIEW OF SYSTEMS:   Constitutional: Denies fevers, chills or abnormal weight loss Eyes: Denies blurriness of vision Ears, nose, mouth, throat, and face: Denies mucositis or sore throat Respiratory: Denies cough, dyspnea or wheezes Cardiovascular: Denies palpitation, chest discomfort Gastrointestinal:  Denies nausea, heartburn or change in bowel habits Skin: Petechial rash Lymphatics: Denies new lymphadenopathy or easy bruising Neurological:Denies numbness, tingling or new weaknesses Behavioral/Psych: Mood is stable, no new changes  Extremities: No lower extremity edema All other systems were reviewed with the patient and are negative.  I have reviewed the past medical history, past surgical history, social history and family history with the patient and they are unchanged from previous note.   PHYSICAL EXAMINATION: ECOG PERFORMANCE STATUS: 1 - Symptomatic but completely ambulatory  Vitals:   01/28/19 0534 01/28/19 0857  BP: (!) 103/55 123/66  Pulse: 85 94  Resp: 18 18  Temp: 98.4 F (36.9 C) 99.1 F (37.3 C)  SpO2: 97% 95%   Filed Weights   01/26/19 2105  Weight: 219 lb (99.3 kg)    GENERAL:alert, no distress and comfortable SKIN: skin color, texture, turgor are normal, no rashes or significant lesions EYES: normal, Conjunctiva are pink and non-injected, sclera clear OROPHARYNX:no exudate, no erythema and lips, buccal mucosa, and tongue normal  NECK: supple, thyroid normal size, non-tender, without nodularity LYMPH:  no palpable lymphadenopathy in the cervical, axillary or inguinal LUNGS: clear to auscultation and percussion with normal breathing effort HEART: regular rate & rhythm and no murmurs and no lower extremity edema ABDOMEN:abdomen  soft, non-tender and normal bowel sounds Musculoskeletal:no cyanosis of digits and no clubbing  NEURO: alert & oriented x 3 with fluent speech, no focal motor/sensory deficits  LABORATORY DATA:  I have reviewed the data as listed CMP Latest Ref Rng & Units 01/28/2019 01/27/2019 01/26/2019  Glucose 70 - 99 mg/dL 93 133(H) 183(H)  BUN 6 - 20 mg/dL 20 10 14   Creatinine 0.61 - 1.24 mg/dL 1.20 1.15 1.30(H)  Sodium 135 - 145 mmol/L 132(L) 134(L) 133(L)  Potassium 3.5 - 5.1 mmol/L 3.6 4.1 3.6  Chloride 98 - 111 mmol/L 101 101 100  CO2 22 - 32 mmol/L 23 23 22   Calcium 8.9 - 10.3 mg/dL 8.6(L) 9.3 9.1  Total Protein 6.5 - 8.1 g/dL - - 6.3(L)  Total Bilirubin 0.3 - 1.2 mg/dL - - 1.0  Alkaline Phos 38 - 126 U/L - - 72  AST 15 - 41 U/L - - 23  ALT 0 - 44 U/L - - 26    Lab Results  Component Value Date   WBC 8.2 01/28/2019   HGB 13.0 01/28/2019   HCT 38.7 (L) 01/28/2019   MCV 87.8 01/28/2019   PLT 6 (LL) 01/28/2019   NEUTROABS 5.8 01/27/2019    ASSESSMENT AND PLAN: Acute ITP: Today is day 2 of IVIG.  He is also receiving prednisone. Today's platelet count is 6. We will continue to watch and monitor. Once his platelet count goes above 50 we will be able to start tapering his prednisone dose.

## 2019-01-29 LAB — CBC WITH DIFFERENTIAL/PLATELET
Abs Immature Granulocytes: 0.02 10*3/uL (ref 0.00–0.07)
Basophils Absolute: 0.1 10*3/uL (ref 0.0–0.1)
Basophils Relative: 1 %
Eosinophils Absolute: 0 10*3/uL (ref 0.0–0.5)
Eosinophils Relative: 0 %
HCT: 36.9 % — ABNORMAL LOW (ref 39.0–52.0)
Hemoglobin: 12.4 g/dL — ABNORMAL LOW (ref 13.0–17.0)
Immature Granulocytes: 0 %
Lymphocytes Relative: 10 %
Lymphs Abs: 0.5 10*3/uL — ABNORMAL LOW (ref 0.7–4.0)
MCH: 29.5 pg (ref 26.0–34.0)
MCHC: 33.6 g/dL (ref 30.0–36.0)
MCV: 87.6 fL (ref 80.0–100.0)
Monocytes Absolute: 0.5 10*3/uL (ref 0.1–1.0)
Monocytes Relative: 10 %
Neutro Abs: 4 10*3/uL (ref 1.7–7.7)
Neutrophils Relative %: 79 %
Platelets: 32 10*3/uL — ABNORMAL LOW (ref 150–400)
RBC: 4.21 MIL/uL — ABNORMAL LOW (ref 4.22–5.81)
RDW: 12.8 % (ref 11.5–15.5)
WBC: 5.1 10*3/uL (ref 4.0–10.5)
nRBC: 0 % (ref 0.0–0.2)

## 2019-01-29 MED ORDER — PREDNISONE 20 MG PO TABS: 80.0000 mg | ORAL_TABLET | Freq: Every day | ORAL | 0 refills | Status: DC

## 2019-01-29 NOTE — Progress Notes (Signed)
Hematology progress note  Patient was not seen or examined. Labs reviewed: Platelet count improved from 6 to 32 with 2 doses of IVIG. He is also on prednisone 80 mg daily. Patient's platelet count is likely to be over 50 tomorrow and possibly can be discharged home. I will see him next week in my office to recheck his platelet count and start tapering his steroids. Please do not hesitate to call us if you have any questions or concerns.

## 2019-01-29 NOTE — Discharge Summary (Signed)
Physician Discharge Summary  Kenneth Juarez [id number] DOB: January 15, 1961 DOA: 01/26/2019  PCP: [name], MD  Admit date: 01/26/2019 Discharge date: 01/29/2019  Admitted From: Home Disposition:  Home  Discharge Condition:Stable CODE STATUS:FULL Diet recommendation: Heart Healthy  Brief/Interim Summary:  Patient is a 58 year old male with history of depression, anxiety, hypertension, ITP, suspected autoimmune hemolytic anemia who presents the emergency department for the evaluation of petechial rash, low platelets on outpatient blood work.  Patient admitted for the management of ITP.  Hematology was following.  He was started on prednisone and IVIG.  Platelets level increased to 32,000 today.  He is stable for discharge to home today.  He will follow-up with hematology within a week and check CBC as an outpatient.   Following problems were addressed during his hospitalization:  ITP: Known history of ITP.  Presented with very low platelets, petechial rash.  Hematology consulted and following.  Started on prednisone and IVIG.  Platelets today is 32000.  He denies any bleeding or bruising.  No frank petechial rashes seen today.  History of ITP in 2008, 2010 and autoimmune hemolytic anemia in 2014.  DIC panel negative. Stable for discharge.  Continue prednisone 80 mg daily until follow-up with hematology.  Check CBC during the follow-up.  Hypertension: Currently blood stable.  Continue atenolol, lisinopril-hydrochlorothiazide  Depression/anxiety: Continue Paxil, as needed Xanax  Discharge Diagnoses:  Principal Problem:   Thrombocytopenia ([place]) Active Problems:   Depression with anxiety   Essential hypertension   Acute ITP [facility])    Discharge Instructions  Discharge Instructions    Diet - low sodium heart healthy   Complete by: As directed    Discharge instructions   Complete by: As directed    1)Please follow up with Dr Lindi Adie in a week. Check CBC during the follow  up. 2)Take prescribed medications as instructed.   Increase activity slowly   Complete by: As directed      Allergies as of 01/29/2019   No Known Allergies     Medication List    TAKE these medications   ALPRAZolam 0.5 MG tablet Commonly known as: XANAX Take 0.5 mg by mouth 2 (two) times daily as needed for anxiety.   atenolol 25 MG tablet Commonly known as: TENORMIN Take 25 mg by mouth daily.   atorvastatin 10 MG tablet Commonly known as: LIPITOR Take 20 mg by mouth every evening.   cholecalciferol 25 MCG (1000 UT) tablet Commonly known as: VITAMIN D3 Take 1,000 Units by mouth daily.   lisinopril-hydrochlorothiazide 10-12.5 MG tablet Commonly known as: ZESTORETIC Take 1 tablet by mouth daily.   PARoxetine 20 MG tablet Commonly known as: PAXIL Take 20 mg by mouth daily.   predniSONE 20 MG tablet Commonly known as: DELTASONE Take 4 tablets (80 mg total) by mouth daily for 10 days. Start taking on: January 30, 2019      Follow-up Information    Nicholas Lose, MD. Schedule an appointment as soon as possible for a visit in 1 week(s).   Specialty: Hematology and Oncology Contact information: [place] [address] [phone]          No Known Allergies  Consultations:  Hematology   Procedures/Studies:  No results found.    Subjective:  Patient seen and examined the bedside this morning.  Hemodynamically stable for discharge.  Discharge Exam: Vitals:   01/29/19 0400 01/29/19 0906  BP: 119/75 128/71  Pulse: 66 82  Resp: 18 18  Temp: 98.3 F (36.8 C)  98.6 F (37 C)  SpO2: 95% 95%   Vitals:   01/28/19 2053 01/28/19 2150 01/29/19 0400 01/29/19 0906  BP: 119/66 112/66 119/75 128/71  Pulse: 70 72 66 82  Resp: 16 18 18 18   Temp: 99.4 F (37.4 C) 98.7 F (37.1 C) 98.3 F (36.8 C) 98.6 F (37 C)  TempSrc: Oral Oral Oral Oral  SpO2: 97% 98% 95% 95%  Weight: 99.1 kg     Height:        General: Pt is alert,  awake, not in acute distress Cardiovascular: RRR, S1/S2 +, no rubs, no gallops Respiratory: CTA bilaterally, no wheezing, no rhonchi Abdominal: Soft, NT, ND, bowel sounds + Extremities: no edema, no cyanosis    The results of significant diagnostics from this hospitalization (including imaging, microbiology, ancillary and laboratory) are listed below for reference.     Microbiology: Recent Results (from the past 240 hour(s))  SARS CORONAVIRUS 2 (TAT 6-12 HRS)     Status: None   Collection Time: 01/27/19  2:30 AM  Result Value Ref Range Status   SARS Coronavirus 2 NEGATIVE NEGATIVE Final    Comment: (NOTE) SARS-CoV-2 target nucleic acids are NOT DETECTED. The SARS-CoV-2 RNA is generally detectable in upper and lower respiratory specimens during the acute phase of infection. Negative results do not preclude SARS-CoV-2 infection, do not rule out co-infections with other pathogens, and should not be used as the sole basis for treatment or other patient management decisions. Negative results must be combined with clinical observations, patient history, and epidemiological information. The expected result is Negative. Fact Sheet for Patients: SugarRoll.be Fact Sheet for Healthcare Providers: https://www.woods-mathews.com/ This test is not yet approved or cleared by the [place] FDA and  has been authorized for detection and/or diagnosis of SARS-CoV-2 by FDA under an Emergency Use Authorization (EUA). This EUA will remain  in effect (meaning this test can be used) for the duration of the COVID-19 declaration under Section 56 4(b)(1) of the Act, 21 U.S.C. section 360bbb-3(b)(1), unless the authorization is terminated or revoked sooner. Performed at [facility], [place] [address] [place], [place] [address]      Labs: BNP (last 3 results) No results for input(s): BNP in the last 8760 hours. Basic Metabolic Panel: Recent Labs   Lab 01/26/19 2108 01/27/19 0721 01/28/19 0332  NA 133* 134* 132*  K 3.6 4.1 3.6  CL 100 101 101  CO2 22 23 23   GLUCOSE 183* 133* 93  BUN 14 10 20   CREATININE 1.30* 1.15 1.20  CALCIUM 9.1 9.3 8.6*   Liver Function Tests: Recent Labs  Lab 01/26/19 2108  AST 23  ALT 26  ALKPHOS 72  BILITOT 1.0  PROT 6.3*  ALBUMIN 3.8   No results for input(s): LIPASE, AMYLASE in the last 168 hours. No results for input(s): AMMONIA in the last 168 hours. CBC: Recent Labs  Lab 01/26/19 2108 01/27/19 0721 01/27/19 1158 01/28/19 0332 01/29/19 0345  WBC 8.4 6.7  --  8.2 5.1  NEUTROABS 5.6 5.8  --   --  4.0  HGB 15.2 15.0  --  13.0 12.4*  HCT 45.7 44.3  --  38.7* 36.9*  MCV 87.4 85.7  --  87.8 87.6  PLT <5* <5* <5* 6* 32*   Cardiac Enzymes: No results for input(s): CKTOTAL, CKMB, CKMBINDEX, TROPONINI in the last 168 hours. BNP: Invalid input(s): POCBNP CBG: No results for input(s): GLUCAP in the last 168 hours. D-Dimer Recent Labs    01/27/19 1158  DDIMER 0.33   Hgb A1c No results for input(s): HGBA1C in the last 72 hours. Lipid Profile No results for input(s): CHOL, HDL, LDLCALC, TRIG, CHOLHDL, LDLDIRECT in the last 72 hours. Thyroid function studies No results for input(s): TSH, T4TOTAL, T3FREE, THYROIDAB in the last 72 hours.  Invalid input(s): FREET3 Anemia work up Recent Labs    01/27/19 1158  RETICCTPCT 1.7   Urinalysis No results found for: COLORURINE, APPEARANCEUR, [place], [place], [place], [place], [place], [place], [place], UROBILINOGEN, NITRITE, LEUKOCYTESUR Sepsis Labs Invalid input(s): PROCALCITONIN,  WBC,  LACTICIDVEN Microbiology Recent Results (from the past 240 hour(s))  SARS CORONAVIRUS 2 (TAT 6-12 HRS)     Status: None   Collection Time: 01/27/19  2:30 AM  Result Value Ref Range Status   SARS Coronavirus 2 NEGATIVE NEGATIVE Final    Comment: (NOTE) SARS-CoV-2 target nucleic acids are NOT DETECTED. The SARS-CoV-2 RNA is generally  detectable in upper and lower respiratory specimens during the acute phase of infection. Negative results do not preclude SARS-CoV-2 infection, do not rule out co-infections with other pathogens, and should not be used as the sole basis for treatment or other patient management decisions. Negative results must be combined with clinical observations, patient history, and epidemiological information. The expected result is Negative. Fact Sheet for Patients: SugarRoll.be Fact Sheet for Healthcare Providers: https://www.woods-mathews.com/ This test is not yet approved or cleared by the [place] FDA and  has been authorized for detection and/or diagnosis of SARS-CoV-2 by FDA under an Emergency Use Authorization (EUA). This EUA will remain  in effect (meaning this test can be used) for the duration of the COVID-19 declaration under Section 56 4(b)(1) of the Act, 21 U.S.C. section 360bbb-3(b)(1), unless the authorization is terminated or revoked sooner. Performed at [facility], [place] [address] [place], [place] [address]     Please note: You were cared for by a hospitalist during your hospital stay. Once you are discharged, your primary care physician will handle any further medical issues. Please note that NO REFILLS for any discharge medications will be authorized once you are discharged, as it is imperative that you return to your primary care physician (or establish a relationship with a primary care physician if you do not have one) for your post hospital discharge needs so that they can reassess your need for medications and monitor your lab values.    Time coordinating discharge: 40 minutes  SIGNED:   Shelly Coss, MD  [facility] 01/29/2019, 10:41 AM Pager [phone]  If 7PM-7AM, please contact night-coverage [email or web address] Password [email or web address]

## 2019-01-29 NOTE — Plan of Care (Signed)
  Problem: Health Behavior/Discharge Planning: Goal: Ability to manage health-related needs will improve 01/29/2019 1056 by Dolores Hoose, RN Outcome: Adequate for Discharge 01/29/2019 0750 by Dolores Hoose, RN Outcome: Progressing   Problem: Clinical Measurements: Goal: Ability to maintain clinical measurements within normal limits will improve 01/29/2019 1056 by Dolores Hoose, RN Outcome: Adequate for Discharge 01/29/2019 0750 by Dolores Hoose, RN Outcome: Progressing   Problem: Clinical Measurements: Goal: Diagnostic test results will improve 01/29/2019 1056 by Dolores Hoose, RN Outcome: Adequate for Discharge 01/29/2019 0750 by Dolores Hoose, RN Outcome: Progressing

## 2019-01-29 NOTE — Progress Notes (Addendum)
DISCHARGE NOTE  Kenneth Juarez to be discharged Home per MD order. Patient verbalized understanding.  Skin clean, dry and intact without evidence of skin break down, no evidence of skin tears noted. IV catheter discontinued intact. Site without signs and symptoms of complications. Dressing and pressure applied. Pt denies pain at the site currently. No complaints noted.  Patient free of lines, drains, and wounds.   Discharge packet assembled. An After Visit Summary (AVS) was printed and given to the patient at bedside. Patient ambulatory, walked himself and discharged to designated family member via private vehicle.  All questions and concerns addressed.   Dolores Hoose, RN

## 2019-01-29 NOTE — Plan of Care (Signed)
  Problem: Clinical Measurements: Goal: Ability to maintain clinical measurements within normal limits will improve Outcome: Progressing   Problem: Health Behavior/Discharge Planning: Goal: Ability to manage health-related needs will improve Outcome: Progressing   Problem: Clinical Measurements: Goal: Diagnostic test results will improve Outcome: Progressing

## 2019-02-01 ENCOUNTER — Telehealth: Payer: Self-pay | Admitting: Hematology and Oncology

## 2019-02-01 NOTE — Telephone Encounter (Signed)
Received a call from Kenneth Juarez to schedule a hospital fu appt w/Dr. Lindi Adie. I was able to schedule an appt for Kenneth Juarez was scheduled to see Dr. Lindi Adie on 9/24 at 830am w/labs at 8am.

## 2019-02-04 ENCOUNTER — Telehealth: Payer: Self-pay

## 2019-02-04 NOTE — Telephone Encounter (Signed)
Pt currently on steroids 80mg  daily for ITP as instructed during hospital admission.  Pt asking when this should be tapered down, has follow up 9/24 with MD.    RN encouraged patient to continue 80mg  dosage until further recommendations from MD.  Will review with MD for tapering directions.  Pt voiced understanding and agreement.

## 2019-02-08 ENCOUNTER — Other Ambulatory Visit: Payer: Self-pay | Admitting: *Deleted

## 2019-02-08 MED ORDER — PREDNISONE 20 MG PO TABS: 80.0000 mg | ORAL_TABLET | Freq: Every day | ORAL | 0 refills | Status: DC

## 2019-02-09 ENCOUNTER — Other Ambulatory Visit: Payer: Self-pay | Admitting: *Deleted

## 2019-02-09 DIAGNOSIS — C9002 Multiple myeloma in relapse: Secondary | ICD-10-CM

## 2019-02-10 ENCOUNTER — Other Ambulatory Visit: Payer: Self-pay

## 2019-02-10 ENCOUNTER — Telehealth: Payer: Self-pay | Admitting: Hematology and Oncology

## 2019-02-10 ENCOUNTER — Telehealth: Payer: Self-pay | Admitting: *Deleted

## 2019-02-10 ENCOUNTER — Inpatient Hospital Stay: Payer: BC Managed Care – PPO | Attending: Hematology and Oncology

## 2019-02-10 DIAGNOSIS — C9000 Multiple myeloma not having achieved remission: Secondary | ICD-10-CM | POA: Insufficient documentation

## 2019-02-10 DIAGNOSIS — C9002 Multiple myeloma in relapse: Secondary | ICD-10-CM

## 2019-02-10 LAB — CMP (CANCER CENTER ONLY)
ALT: 44 U/L (ref 0–44)
AST: 24 U/L (ref 15–41)
Albumin: 3.2 g/dL — ABNORMAL LOW (ref 3.5–5.0)
Alkaline Phosphatase: 66 U/L (ref 38–126)
Anion gap: 7 (ref 5–15)
BUN: 20 mg/dL (ref 6–20)
CO2: 23 mmol/L (ref 22–32)
Calcium: 8.9 mg/dL (ref 8.9–10.3)
Chloride: 102 mmol/L (ref 98–111)
Creatinine: 1.3 mg/dL — ABNORMAL HIGH (ref 0.61–1.24)
GFR, Est AFR Am: >60 mL/min (ref >60)
GFR, Estimated: >60 mL/min (ref >60)
Glucose, Bld: 106 mg/dL — ABNORMAL HIGH (ref 70–99)
Potassium: 4.3 mmol/L (ref 3.5–5.1)
Sodium: 132 mmol/L — ABNORMAL LOW (ref 135–145)
Total Bilirubin: 0.8 mg/dL (ref 0.3–1.2)
Total Protein: 7.6 g/dL (ref 6.5–8.1)

## 2019-02-10 LAB — CBC WITH DIFFERENTIAL (CANCER CENTER ONLY)
Abs Immature Granulocytes: 0.07 10*3/uL (ref 0.00–0.07)
Basophils Absolute: 0 10*3/uL (ref 0.0–0.1)
Basophils Relative: 0 %
Eosinophils Absolute: 0 10*3/uL (ref 0.0–0.5)
Eosinophils Relative: 0 %
HCT: 44 % (ref 39.0–52.0)
Hemoglobin: 15 g/dL (ref 13.0–17.0)
Immature Granulocytes: 1 %
Lymphocytes Relative: 8 %
Lymphs Abs: 1 10*3/uL (ref 0.7–4.0)
MCH: 29.6 pg (ref 26.0–34.0)
MCHC: 34.1 g/dL (ref 30.0–36.0)
MCV: 87 fL (ref 80.0–100.0)
Monocytes Absolute: 0.6 10*3/uL (ref 0.1–1.0)
Monocytes Relative: 5 %
Neutro Abs: 11.7 10*3/uL — ABNORMAL HIGH (ref 1.7–7.7)
Neutrophils Relative %: 86 %
Platelet Count: 334 10*3/uL (ref 150–400)
RBC: 5.06 MIL/uL (ref 4.22–5.81)
RDW: 13.9 % (ref 11.5–15.5)
WBC Count: 13.4 10*3/uL — ABNORMAL HIGH (ref 4.0–10.5)
nRBC: 0 % (ref 0.0–0.2)

## 2019-02-10 NOTE — Telephone Encounter (Signed)
Returned patient's phone call regarding scheduling an appointment, left a voicemail. 

## 2019-02-10 NOTE — Telephone Encounter (Signed)
Pt plt count today is 334.  Per Dr. Lindi Adie pt to start tapering down on his steroid dose by 10 mg weekly x4 weeks and we will recheck his labs.   Pt currently taking 80 mg daily.  Starting tomorrow 9/11 he will take 70 mg, 9/18 decrease to 60 mg, 9/25 decrease to 50 mg, 10/2 decrease to 40mg .  On 10/8 pt will have labs checked and based on the plt count Dr. Lindi Adie will decide if further tapering is needed.

## 2019-02-18 ENCOUNTER — Other Ambulatory Visit: Payer: Self-pay | Admitting: Hematology and Oncology

## 2019-02-24 ENCOUNTER — Other Ambulatory Visit: Payer: BC Managed Care – PPO

## 2019-02-24 ENCOUNTER — Inpatient Hospital Stay: Payer: BC Managed Care – PPO | Admitting: Hematology and Oncology

## 2019-03-03 ENCOUNTER — Other Ambulatory Visit: Payer: Self-pay | Admitting: Hematology and Oncology

## 2019-03-03 ENCOUNTER — Other Ambulatory Visit: Payer: Self-pay

## 2019-03-03 MED ORDER — PREDNISONE 20 MG PO TABS: 40.0000 mg | ORAL_TABLET | Freq: Every day | ORAL | 0 refills | Status: AC

## 2019-03-08 DIAGNOSIS — Z862 Personal history of diseases of the blood and blood-forming organs and certain disorders involving the immune mechanism: Secondary | ICD-10-CM | POA: Diagnosis not present

## 2019-03-08 DIAGNOSIS — D693 Immune thrombocytopenic purpura: Secondary | ICD-10-CM | POA: Diagnosis not present

## 2019-03-08 DIAGNOSIS — Z125 Encounter for screening for malignant neoplasm of prostate: Secondary | ICD-10-CM | POA: Diagnosis not present

## 2019-03-08 DIAGNOSIS — I10 Essential (primary) hypertension: Secondary | ICD-10-CM | POA: Diagnosis not present

## 2019-03-08 DIAGNOSIS — E7800 Pure hypercholesterolemia, unspecified: Secondary | ICD-10-CM | POA: Diagnosis not present

## 2019-03-08 DIAGNOSIS — Z23 Encounter for immunization: Secondary | ICD-10-CM | POA: Diagnosis not present

## 2019-03-08 DIAGNOSIS — R7303 Prediabetes: Secondary | ICD-10-CM | POA: Diagnosis not present

## 2019-03-08 DIAGNOSIS — Z0000 Encounter for general adult medical examination without abnormal findings: Secondary | ICD-10-CM | POA: Diagnosis not present

## 2019-03-08 DIAGNOSIS — Z6834 Body mass index (BMI) 34.0-34.9, adult: Secondary | ICD-10-CM | POA: Diagnosis not present

## 2019-03-09 ENCOUNTER — Other Ambulatory Visit: Payer: Self-pay | Admitting: *Deleted

## 2019-03-09 DIAGNOSIS — D693 Immune thrombocytopenic purpura: Secondary | ICD-10-CM

## 2019-03-09 DIAGNOSIS — C9002 Multiple myeloma in relapse: Secondary | ICD-10-CM

## 2019-03-09 NOTE — Progress Notes (Signed)
Patient Care Team: [name], MD as PCP - General (Family Medicine)  DIAGNOSIS:    ICD-10-CM   1. Acute ITP (HCC)  D69.3 CBC with Differential (Cancer Center Only)    CHIEF COMPLIANT: Follow-up of recent hospitalization for ITP  INTERVAL HISTORY: Kenneth Juarez is a 58 y.o. with above-mentioned history of ITP successfully treated with IVIG and prednisone, and suspected autoimmune hemolytic anemia treated witvh prednisone. He was admitted from 8/26-8/29 after presenting to Urgent care for a petechial rash and labs showed platelets <5,000. He was started on prednisone 80mg  daily.  He is currently on 40 mg of prednisone.  His major complaints are related to increased appetite and decreased sleep and also having some irritability.  he presents to the clinic today for follow-up to review his labs.   REVIEW OF SYSTEMS:   Constitutional: Denies fevers, chills or abnormal weight loss Eyes: Denies blurriness of vision Ears, nose, mouth, throat, and face: Denies mucositis or sore throat Respiratory: Denies cough, dyspnea or wheezes Cardiovascular: Denies palpitation, chest discomfort Gastrointestinal: Denies nausea, heartburn or change in bowel habits Skin: Denies abnormal skin rashes Lymphatics: Denies new lymphadenopathy or easy bruising Neurological: Denies numbness, tingling or new weaknesses Behavioral/Psych: Mood is stable, no new changes  Extremities: No lower extremity edema All other systems were reviewed with the patient and are negative.  I have reviewed the past medical history, past surgical history, social history and family history with the patient and they are unchanged from previous note.  ALLERGIES:  has No Known Allergies.  MEDICATIONS:  Current Outpatient Medications  Medication Sig Dispense Refill  . ALPRAZolam (XANAX) 0.5 MG tablet Take 0.5 mg by mouth 2 (two) times daily as needed for anxiety.    Marland Kitchen atenolol (TENORMIN) 25 MG tablet Take 25 mg by mouth daily.     Marland Kitchen atorvastatin (LIPITOR) 10 MG tablet Take 20 mg by mouth every evening.     . cholecalciferol (VITAMIN D3) 25 MCG (1000 UT) tablet Take 1,000 Units by mouth daily.    Marland Kitchen lisinopril-hydrochlorothiazide (PRINZIDE,ZESTORETIC) 10-12.5 MG per tablet Take 1 tablet by mouth daily.    Marland Kitchen PARoxetine (PAXIL) 20 MG tablet Take 20 mg by mouth daily.    . predniSONE (DELTASONE) 20 MG tablet Take 2 tablets (40 mg total) by mouth daily for 7 days. 14 tablet 0   No current facility-administered medications for this visit.     PHYSICAL EXAMINATION: ECOG PERFORMANCE STATUS: 1 - Symptomatic but completely ambulatory  Vitals:   03/10/19 0821  BP: 130/80  Pulse: 72  Resp: 18  Temp: 97.8 F (36.6 C)  SpO2: 96%   Filed Weights   03/10/19 0821  Weight: 216 lb (98 kg)    GENERAL: alert, no distress and comfortable SKIN: skin color, texture, turgor are normal, no rashes or significant lesions EYES: normal, Conjunctiva are pink and non-injected, sclera clear OROPHARYNX: no exudate, no erythema and lips, buccal mucosa, and tongue normal  NECK: supple, thyroid normal size, non-tender, without nodularity LYMPH: no palpable lymphadenopathy in the cervical, axillary or inguinal LUNGS: clear to auscultation and percussion with normal breathing effort HEART: regular rate & rhythm and no murmurs and no lower extremity edema ABDOMEN: abdomen soft, non-tender and normal bowel sounds MUSCULOSKELETAL: no cyanosis of digits and no clubbing  NEURO: alert & oriented x 3 with fluent speech, no focal motor/sensory deficits EXTREMITIES: No lower extremity edema  LABORATORY DATA:  I have reviewed the data as listed CMP Latest Ref Rng & Units  02/10/2019 01/28/2019 01/27/2019  Glucose 70 - 99 mg/dL 106(H) 93 133(H)  BUN 6 - 20 mg/dL 20 20 10   Creatinine 0.61 - 1.24 mg/dL 1.30(H) 1.20 1.15  Sodium 135 - 145 mmol/L 132(L) 132(L) 134(L)  Potassium 3.5 - 5.1 mmol/L 4.3 3.6 4.1  Chloride 98 - 111 mmol/L 102 101 101  CO2  22 - 32 mmol/L 23 23 23   Calcium 8.9 - 10.3 mg/dL 8.9 8.6(L) 9.3  Total Protein 6.5 - 8.1 g/dL 7.6 - -  Total Bilirubin 0.3 - 1.2 mg/dL 0.8 - -  Alkaline Phos 38 - 126 U/L 66 - -  AST 15 - 41 U/L 24 - -  ALT 0 - 44 U/L 44 - -    Lab Results  Component Value Date   WBC 11.3 (H) 03/10/2019   HGB 14.7 03/10/2019   HCT 43.8 03/10/2019   MCV 90.3 03/10/2019   PLT 349 03/10/2019   NEUTROABS 9.6 (H) 03/10/2019    ASSESSMENT & PLAN:  Acute ITP ([place]) Diagnosed with a platelet count of 6 Treatment: IVIG in the hospital 01/28/2019 followed by prednisone taper  Lab review: Platelets 349. He will continue to taper his prednisone down from 40 mg currently all the way down to 0 once a week. I would like to see him in 2 months to recheck on a CBC and follow-up.   Orders Placed This Encounter  Procedures  . CBC with Differential (Cancer Center Only)    Standing Status:   Future    Standing Expiration Date:   03/09/2020   The patient has a good understanding of the overall plan. he agrees with it. he will call with any problems that may develop before the next visit here.  Nicholas Lose, MD 03/10/2019  Julious Oka Dorshimer am acting as scribe for Dr. Nicholas Lose.  I have reviewed the above documentation for accuracy and completeness, and I agree with the above.

## 2019-03-10 ENCOUNTER — Inpatient Hospital Stay: Payer: BC Managed Care – PPO | Admitting: Hematology and Oncology

## 2019-03-10 ENCOUNTER — Inpatient Hospital Stay: Payer: BC Managed Care – PPO | Attending: Hematology and Oncology

## 2019-03-10 ENCOUNTER — Other Ambulatory Visit: Payer: Self-pay

## 2019-03-10 DIAGNOSIS — D693 Immune thrombocytopenic purpura: Secondary | ICD-10-CM | POA: Insufficient documentation

## 2019-03-10 DIAGNOSIS — Z79899 Other long term (current) drug therapy: Secondary | ICD-10-CM | POA: Diagnosis not present

## 2019-03-10 LAB — CBC WITH DIFFERENTIAL (CANCER CENTER ONLY)
Abs Immature Granulocytes: 0.08 10*3/uL — ABNORMAL HIGH (ref 0.00–0.07)
Basophils Absolute: 0.1 10*3/uL (ref 0.0–0.1)
Basophils Relative: 1 %
Eosinophils Absolute: 0 10*3/uL (ref 0.0–0.5)
Eosinophils Relative: 0 %
HCT: 43.8 % (ref 39.0–52.0)
Hemoglobin: 14.7 g/dL (ref 13.0–17.0)
Immature Granulocytes: 1 %
Lymphocytes Relative: 10 %
Lymphs Abs: 1.2 10*3/uL (ref 0.7–4.0)
MCH: 30.3 pg (ref 26.0–34.0)
MCHC: 33.6 g/dL (ref 30.0–36.0)
MCV: 90.3 fL (ref 80.0–100.0)
Monocytes Absolute: 0.4 10*3/uL (ref 0.1–1.0)
Monocytes Relative: 4 %
Neutro Abs: 9.6 10*3/uL — ABNORMAL HIGH (ref 1.7–7.7)
Neutrophils Relative %: 84 %
Platelet Count: 349 10*3/uL (ref 150–400)
RBC: 4.85 MIL/uL (ref 4.22–5.81)
RDW: 13.7 % (ref 11.5–15.5)
WBC Count: 11.3 10*3/uL — ABNORMAL HIGH (ref 4.0–10.5)
nRBC: 0 % (ref 0.0–0.2)

## 2019-03-10 LAB — CMP (CANCER CENTER ONLY)
ALT: 36 U/L (ref 0–44)
AST: 17 U/L (ref 15–41)
Albumin: 3.4 g/dL — ABNORMAL LOW (ref 3.5–5.0)
Alkaline Phosphatase: 63 U/L (ref 38–126)
Anion gap: 10 (ref 5–15)
BUN: 19 mg/dL (ref 6–20)
CO2: 24 mmol/L (ref 22–32)
Calcium: 9.1 mg/dL (ref 8.9–10.3)
Chloride: 103 mmol/L (ref 98–111)
Creatinine: 1.15 mg/dL (ref 0.61–1.24)
GFR, Est AFR Am: >60 mL/min (ref >60)
GFR, Estimated: >60 mL/min (ref >60)
Glucose, Bld: 126 mg/dL — ABNORMAL HIGH (ref 70–99)
Potassium: 4.6 mmol/L (ref 3.5–5.1)
Sodium: 137 mmol/L (ref 135–145)
Total Bilirubin: 0.3 mg/dL (ref 0.3–1.2)
Total Protein: 6.8 g/dL (ref 6.5–8.1)

## 2019-03-10 NOTE — Assessment & Plan Note (Signed)
Diagnosed with a platelet count of 6 Treatment: IVIG in the hospital 01/28/2019 followed by prednisone taper  Lab review:

## 2019-03-11 ENCOUNTER — Telehealth: Payer: Self-pay | Admitting: Hematology and Oncology

## 2019-03-11 NOTE — Telephone Encounter (Signed)
I talk with patient regarding schedule  

## 2019-05-10 ENCOUNTER — Other Ambulatory Visit: Payer: BC Managed Care – PPO

## 2019-05-10 ENCOUNTER — Ambulatory Visit: Payer: BC Managed Care – PPO | Admitting: Hematology and Oncology

## 2019-05-10 NOTE — Progress Notes (Signed)
Patient Care Team: [name], MD as PCP - General (Family Medicine)  DIAGNOSIS:    ICD-10-CM   1. Acute ITP (HCC)  D69.3     CHIEF COMPLIANT: Follow-up of ITP  INTERVAL HISTORY: Kenneth Juarez is a 58 y.o. with above-mentioned history of ITP. He presents to the clinic today for follow-up.  Denies any bruising or bleeding.  He has been off of prednisone since October.  REVIEW OF SYSTEMS:   Constitutional: Denies fevers, chills or abnormal weight loss Eyes: Denies blurriness of vision Ears, nose, mouth, throat, and face: Denies mucositis or sore throat Respiratory: Denies cough, dyspnea or wheezes Cardiovascular: Denies palpitation, chest discomfort Gastrointestinal: Denies nausea, heartburn or change in bowel habits Skin: Denies abnormal skin rashes Lymphatics: Denies new lymphadenopathy or easy bruising Neurological: Denies numbness, tingling or new weaknesses Behavioral/Psych: Mood is stable, no new changes  Extremities: No lower extremity edema All other systems were reviewed with the patient and are negative.  I have reviewed the past medical history, past surgical history, social history and family history with the patient and they are unchanged from previous note.  ALLERGIES:  has No Known Allergies.  MEDICATIONS:  Current Outpatient Medications  Medication Sig Dispense Refill  . ALPRAZolam (XANAX) 0.5 MG tablet Take 0.5 mg by mouth 2 (two) times daily as needed for anxiety.    Marland Kitchen atenolol (TENORMIN) 25 MG tablet Take 25 mg by mouth daily.    Marland Kitchen atorvastatin (LIPITOR) 10 MG tablet Take 20 mg by mouth every evening.     . cholecalciferol (VITAMIN D3) 25 MCG (1000 UT) tablet Take 1,000 Units by mouth daily.    Marland Kitchen lisinopril-hydrochlorothiazide (PRINZIDE,ZESTORETIC) 10-12.5 MG per tablet Take 1 tablet by mouth daily.    Marland Kitchen PARoxetine (PAXIL) 20 MG tablet Take 20 mg by mouth daily.     No current facility-administered medications for this visit.     PHYSICAL  EXAMINATION: ECOG PERFORMANCE STATUS: 0 - Asymptomatic  Vitals:   05/11/19 0816  BP: 122/76  Pulse: 74  Resp: 18  Temp: 99.1 F (37.3 C)  SpO2: 97%   Filed Weights   05/11/19 0816  Weight: 222 lb 1.6 oz (100.7 kg)    GENERAL: alert, no distress and comfortable SKIN: skin color, texture, turgor are normal, no rashes or significant lesions EYES: normal, Conjunctiva are pink and non-injected, sclera clear OROPHARYNX: no exudate, no erythema and lips, buccal mucosa, and tongue normal  NECK: supple, thyroid normal size, non-tender, without nodularity LYMPH: no palpable lymphadenopathy in the cervical, axillary or inguinal LUNGS: clear to auscultation and percussion with normal breathing effort HEART: regular rate & rhythm and no murmurs and no lower extremity edema ABDOMEN: abdomen soft, non-tender and normal bowel sounds MUSCULOSKELETAL: no cyanosis of digits and no clubbing  NEURO: alert & oriented x 3 with fluent speech, no focal motor/sensory deficits EXTREMITIES: No lower extremity edema  LABORATORY DATA:  I have reviewed the data as listed CMP Latest Ref Rng & Units 03/10/2019 02/10/2019 01/28/2019  Glucose 70 - 99 mg/dL 126(H) 106(H) 93  BUN 6 - 20 mg/dL 19 20 20   Creatinine 0.61 - 1.24 mg/dL 1.15 1.30(H) 1.20  Sodium 135 - 145 mmol/L 137 132(L) 132(L)  Potassium 3.5 - 5.1 mmol/L 4.6 4.3 3.6  Chloride 98 - 111 mmol/L 103 102 101  CO2 22 - 32 mmol/L 24 23 23   Calcium 8.9 - 10.3 mg/dL 9.1 8.9 8.6(L)  Total Protein 6.5 - 8.1 g/dL 6.8 7.6 -  Total Bilirubin  0.3 - 1.2 mg/dL 0.3 0.8 -  Alkaline Phos 38 - 126 U/L 63 66 -  AST 15 - 41 U/L 17 24 -  ALT 0 - 44 U/L 36 44 -    Lab Results  Component Value Date   WBC 6.3 05/11/2019   HGB 14.5 05/11/2019   HCT 44.0 05/11/2019   MCV 88.7 05/11/2019   PLT 322 05/11/2019   NEUTROABS 4.0 05/11/2019    ASSESSMENT & PLAN:  Acute ITP ([place]) Prior ITP episodes: 2008, 2010, 2020 Diagnosed with a platelet count of 6 Treatment:  IVIG in the hospital 01/28/2019 followed by prednisone taper completed October 2020  Lab review: 05/11/19: Platelet count 322 Since there has not been any further issues with thrombocytopenia, he will be seen on an as-needed basis.   No orders of the defined types were placed in this encounter.  The patient has a good understanding of the overall plan. he agrees with it. he will call with any problems that may develop before the next visit here.  Nicholas Lose, MD 05/11/2019  Julious Oka Dorshimer, am acting as scribe for Dr. Nicholas Lose.  I have reviewed the above documentation for accuracy and completeness, and I agree with the above.

## 2019-05-11 ENCOUNTER — Inpatient Hospital Stay: Payer: BC Managed Care – PPO | Admitting: Hematology and Oncology

## 2019-05-11 ENCOUNTER — Other Ambulatory Visit: Payer: Self-pay

## 2019-05-11 ENCOUNTER — Inpatient Hospital Stay: Payer: BC Managed Care – PPO | Attending: Hematology and Oncology

## 2019-05-11 DIAGNOSIS — Z79899 Other long term (current) drug therapy: Secondary | ICD-10-CM | POA: Diagnosis not present

## 2019-05-11 DIAGNOSIS — D693 Immune thrombocytopenic purpura: Secondary | ICD-10-CM | POA: Diagnosis not present

## 2019-05-11 LAB — CBC WITH DIFFERENTIAL (CANCER CENTER ONLY)
Abs Immature Granulocytes: 0.01 10*3/uL (ref 0.00–0.07)
Basophils Absolute: 0.1 10*3/uL (ref 0.0–0.1)
Basophils Relative: 2 %
Eosinophils Absolute: 0.3 10*3/uL (ref 0.0–0.5)
Eosinophils Relative: 5 %
HCT: 44 % (ref 39.0–52.0)
Hemoglobin: 14.5 g/dL (ref 13.0–17.0)
Immature Granulocytes: 0 %
Lymphocytes Relative: 20 %
Lymphs Abs: 1.3 10*3/uL (ref 0.7–4.0)
MCH: 29.2 pg (ref 26.0–34.0)
MCHC: 33 g/dL (ref 30.0–36.0)
MCV: 88.7 fL (ref 80.0–100.0)
Monocytes Absolute: 0.6 10*3/uL (ref 0.1–1.0)
Monocytes Relative: 9 %
Neutro Abs: 4 10*3/uL (ref 1.7–7.7)
Neutrophils Relative %: 64 %
Platelet Count: 322 10*3/uL (ref 150–400)
RBC: 4.96 MIL/uL (ref 4.22–5.81)
RDW: 12.5 % (ref 11.5–15.5)
WBC Count: 6.3 10*3/uL (ref 4.0–10.5)
nRBC: 0 % (ref 0.0–0.2)

## 2019-05-11 NOTE — Assessment & Plan Note (Signed)
Diagnosed with a platelet count of 6 Treatment: IVIG in the hospital 01/28/2019 followed by prednisone taper  Lab review: 05/11/19

## 2022-01-24 ENCOUNTER — Observation Stay (HOSPITAL_COMMUNITY)
Admission: EM | Admit: 2022-01-24 | Discharge: 2022-01-26 | Disposition: A | Discharge status: Home / Self Care | Payer: No Typology Code available for payment source | Attending: Internal Medicine | Admitting: Internal Medicine

## 2022-01-24 ENCOUNTER — Encounter (HOSPITAL_COMMUNITY): Payer: Self-pay | Admitting: Family Medicine

## 2022-01-24 ENCOUNTER — Other Ambulatory Visit: Payer: Self-pay

## 2022-01-24 DIAGNOSIS — Z87891 Personal history of nicotine dependence: Secondary | ICD-10-CM | POA: Diagnosis not present

## 2022-01-24 DIAGNOSIS — Z79899 Other long term (current) drug therapy: Secondary | ICD-10-CM | POA: Diagnosis not present

## 2022-01-24 DIAGNOSIS — N179 Acute kidney failure, unspecified: Secondary | ICD-10-CM | POA: Diagnosis not present

## 2022-01-24 DIAGNOSIS — E669 Obesity, unspecified: Secondary | ICD-10-CM | POA: Diagnosis not present

## 2022-01-24 DIAGNOSIS — D693 Immune thrombocytopenic purpura: Secondary | ICD-10-CM | POA: Insufficient documentation

## 2022-01-24 DIAGNOSIS — E871 Hypo-osmolality and hyponatremia: Secondary | ICD-10-CM | POA: Insufficient documentation

## 2022-01-24 DIAGNOSIS — D696 Thrombocytopenia, unspecified: Secondary | ICD-10-CM | POA: Diagnosis not present

## 2022-01-24 DIAGNOSIS — Z6831 Body mass index (BMI) 31.0-31.9, adult: Secondary | ICD-10-CM | POA: Diagnosis not present

## 2022-01-24 DIAGNOSIS — I10 Essential (primary) hypertension: Secondary | ICD-10-CM | POA: Diagnosis present

## 2022-01-24 DIAGNOSIS — E785 Hyperlipidemia, unspecified: Secondary | ICD-10-CM | POA: Diagnosis present

## 2022-01-24 DIAGNOSIS — F418 Other specified anxiety disorders: Secondary | ICD-10-CM | POA: Diagnosis present

## 2022-01-24 DIAGNOSIS — R233 Spontaneous ecchymoses: Secondary | ICD-10-CM | POA: Diagnosis present

## 2022-01-24 LAB — BASIC METABOLIC PANEL
Anion gap: 9 (ref 5–15)
BUN: 13 mg/dL (ref 8–23)
CO2: 24 mmol/L (ref 22–32)
Calcium: 9.3 mg/dL (ref 8.9–10.3)
Chloride: 102 mmol/L (ref 98–111)
Creatinine, Ser: 1.41 mg/dL — ABNORMAL HIGH (ref 0.61–1.24)
GFR, Estimated: 57 mL/min — ABNORMAL LOW (ref >60)
Glucose, Bld: 119 mg/dL — ABNORMAL HIGH (ref 70–99)
Potassium: 3.5 mmol/L (ref 3.5–5.1)
Sodium: 135 mmol/L (ref 135–145)

## 2022-01-24 LAB — CBC WITH DIFFERENTIAL/PLATELET
Abs Immature Granulocytes: 0.02 10*3/uL (ref 0.00–0.07)
Basophils Absolute: 0.1 10*3/uL (ref 0.0–0.1)
Basophils Relative: 1 %
Eosinophils Absolute: 0.2 10*3/uL (ref 0.0–0.5)
Eosinophils Relative: 3 %
HCT: 45.3 % (ref 39.0–52.0)
Hemoglobin: 15.5 g/dL (ref 13.0–17.0)
Immature Granulocytes: 0 %
Lymphocytes Relative: 28 %
Lymphs Abs: 2 10*3/uL (ref 0.7–4.0)
MCH: 29.8 pg (ref 26.0–34.0)
MCHC: 34.2 g/dL (ref 30.0–36.0)
MCV: 87.1 fL (ref 80.0–100.0)
Monocytes Absolute: 0.7 10*3/uL (ref 0.1–1.0)
Monocytes Relative: 10 %
Neutro Abs: 3.9 10*3/uL (ref 1.7–7.7)
Neutrophils Relative %: 58 %
Platelets: 10 10*3/uL — CL (ref 150–400)
RBC: 5.2 MIL/uL (ref 4.22–5.81)
RDW: 12.2 % (ref 11.5–15.5)
WBC: 6.9 10*3/uL (ref 4.0–10.5)
nRBC: 0 % (ref 0.0–0.2)

## 2022-01-24 MED ORDER — ATORVASTATIN CALCIUM 10 MG PO TABS
20.0000 mg | ORAL_TABLET | Freq: Every day | ORAL | Status: DC
  Administered 2022-01-25: 20 mg via ORAL
  Filled 2022-01-24 (×2): qty 2

## 2022-01-24 MED ORDER — ACETAMINOPHEN 325 MG PO TABS: 650.0000 mg | ORAL_TABLET | Freq: Four times a day (QID) | ORAL | Status: DC | PRN

## 2022-01-24 MED ORDER — SENNOSIDES-DOCUSATE SODIUM 8.6-50 MG PO TABS: 1.0000 | ORAL_TABLET | Freq: Every evening | ORAL | Status: DC | PRN

## 2022-01-24 MED ORDER — ALPRAZOLAM 0.25 MG PO TABS: 0.2500 mg | ORAL_TABLET | Freq: Two times a day (BID) | ORAL | Status: DC | PRN

## 2022-01-24 MED ORDER — ACETAMINOPHEN 650 MG RE SUPP: 650.0000 mg | Freq: Four times a day (QID) | RECTAL | Status: DC | PRN

## 2022-01-24 MED ORDER — METHYLPREDNISOLONE SODIUM SUCC 125 MG IJ SOLR
125.0000 mg | Freq: Every day | INTRAMUSCULAR | Status: DC
  Administered 2022-01-24 – 2022-01-25 (×2): 125 mg via INTRAVENOUS
  Filled 2022-01-24 (×2): qty 2

## 2022-01-24 MED ORDER — IMMUNE GLOBULIN (HUMAN) 10 GM/100ML IV SOLN
1.0000 g/kg | Freq: Once | INTRAVENOUS | Status: AC
  Administered 2022-01-25: 75 g via INTRAVENOUS
  Filled 2022-01-24: qty 750

## 2022-01-24 MED ORDER — PAROXETINE HCL 20 MG PO TABS
20.0000 mg | ORAL_TABLET | Freq: Every day | ORAL | Status: DC
  Administered 2022-01-25 – 2022-01-26 (×2): 20 mg via ORAL
  Filled 2022-01-24 (×2): qty 1

## 2022-01-24 MED ORDER — SODIUM CHLORIDE 0.9% FLUSH
3.0000 mL | Freq: Two times a day (BID) | INTRAVENOUS | Status: DC
  Administered 2022-01-25 – 2022-01-26 (×3): 3 mL via INTRAVENOUS

## 2022-01-24 MED ORDER — ATENOLOL 25 MG PO TABS
25.0000 mg | ORAL_TABLET | Freq: Every day | ORAL | Status: DC
  Administered 2022-01-25 – 2022-01-26 (×2): 25 mg via ORAL
  Filled 2022-01-24 (×2): qty 1

## 2022-01-24 NOTE — ED Provider Notes (Signed)
[facility] EMERGENCY DEPARTMENT Provider Note   CSN: [id number] Arrival date & time: 01/24/22  1743     History  Chief Complaint  Patient presents with   Abnormal Lab    Kenneth Juarez is a 61 y.o. male.  HPI 61 year old male with a history of recurrent ITP (3 prior times) presents with recurrent petechiae and concern for ITP.  He went to his PCP and his platelets were around 10,000.  He states that he has been having petechiae since yesterday but a lot worse today.  He cut himself shaving multiple times and had hard to control bleeding but otherwise has not had any spontaneous bleeding.  No headache or gum bleeding.  Home Medications Prior to Admission medications   Medication Sig Start Date End Date Taking? Authorizing Provider  ALPRAZolam Duanne Moron) 0.5 MG tablet Take 0.5 mg by mouth 2 (two) times daily as needed for anxiety. 08/05/18   [provider]  atenolol (TENORMIN) 25 MG tablet Take 25 mg by mouth daily.    [provider]  atorvastatin (LIPITOR) 10 MG tablet Take 20 mg by mouth every evening.     [provider]  cholecalciferol (VITAMIN D3) 25 MCG (1000 UT) tablet Take 1,000 Units by mouth daily.    [provider]  lisinopril-hydrochlorothiazide (PRINZIDE,ZESTORETIC) 10-12.5 MG per tablet Take 1 tablet by mouth daily.    [provider]  PARoxetine (PAXIL) 20 MG tablet Take 20 mg by mouth daily.    [provider]      Allergies    Patient has no known allergies.    Review of Systems   Review of Systems  Constitutional:  Negative for fever.  Gastrointestinal:  Negative for blood in stool.  Skin:  Positive for rash.  Neurological:  Negative for headaches.    Physical Exam Updated Vital Signs BP (!) 152/86   Pulse 75   Temp 99.3 F (37.4 C) (Oral)   Resp 19   Ht '5\' 7"'$  (1.702 m)   Wt 91.5 kg   SpO2 100%   BMI 31.61 kg/m  Physical Exam Vitals and nursing note reviewed.   Constitutional:      Appearance: He is well-developed.  HENT:     Head: Normocephalic and atraumatic.     Comments: Multiple small wounds consistent with shaving injuries that are no longer bleeding on face    Mouth/Throat:     Comments: No gingival bleeding Eyes:     Extraocular Movements: Extraocular movements intact.     Pupils: Pupils are equal, round, and reactive to light.  Cardiovascular:     Rate and Rhythm: Normal rate and regular rhythm.     Heart sounds: Normal heart sounds.  Pulmonary:     Effort: Pulmonary effort is normal.     Breath sounds: Normal breath sounds.  Abdominal:     General: There is no distension.     Palpations: Abdomen is soft.     Tenderness: There is no abdominal tenderness.  Skin:    General: Skin is warm and dry.     Findings: Petechiae (diffuse, including all 4 extremities, chest) present.  Neurological:     Mental Status: He is alert.     Comments: CN 3-12 grossly intact. 5/5 strength in all 4 extremities. Grossly normal sensation.      ED Results / Procedures / Treatments   Labs (all labs ordered are listed, but only abnormal results are displayed) Labs Reviewed  BASIC METABOLIC PANEL -  Abnormal; Notable for the following components:      Result Value   Glucose, Bld 119 (*)    Creatinine, Ser 1.41 (*)    GFR, Estimated 57 (*)    All other components within normal limits  CBC WITH DIFFERENTIAL/PLATELET - Abnormal; Notable for the following components:   Platelets 10 (*)    All other components within normal limits    EKG None  Radiology No results found.  Procedures .Critical Care  Performed by: Sherwood Gambler, MD Authorized by: Sherwood Gambler, MD   Critical care provider statement:    Critical care time (minutes):  30   Critical care time was exclusive of:  Separately billable procedures and treating other patients   Critical care was necessary to treat or prevent imminent or life-threatening deterioration of the  following conditions:  CNS failure or compromise and circulatory failure   Critical care was time spent personally by me on the following activities:  Development of treatment plan with patient or surrogate, discussions with consultants, evaluation of patient's response to treatment, examination of patient, ordering and review of laboratory studies, ordering and review of radiographic studies, ordering and performing treatments and interventions, pulse oximetry, re-evaluation of patient's condition and review of old charts     Medications Ordered in ED Medications  Immune Globulin 10% (PRIVIGEN) IV infusion 1 g/kg (has no administration in time range)  methylPREDNISolone sodium succinate (SOLU-MEDROL) 125 mg/2 mL injection 125 mg (125 mg Intravenous Given 01/24/22 2125)    ED Course/ Medical Decision Making/ A&P                           Medical Decision Making Risk Prescription drug management. Decision regarding hospitalization.   Patient presents with recurrent ITP.  Platelets 10,000 today.  WBC, hemoglobin are okay.  Discussed with Dr. Benay Spice, he will see in consultation.  Recommends 1 g/KG IVIG, at least 1 dose for now.  Also started on 125 mg daily Solu-Medrol.  Discussed with Dr. Myna Hidalgo for admission.  No headache, neuro findings, etc.  No active bleeding.        Final Clinical Impression(s) / ED Diagnoses Final diagnoses:  Acute ITP [facility])    Rx / DC Orders ED Discharge Orders     None         Sherwood Gambler, MD 01/24/22 2130

## 2022-01-24 NOTE — ED Triage Notes (Signed)
Pt sent here by PCP for low platelet count. Pt has hx of ITP - last occurred in 2020 and was put on prednisone and received an IVIG infusion. Pt denies pain, reports he got his 2nd dose of shingles vaccine on Tuesday. Pt has very minimal petechiae on bilateral arms and in mouth.

## 2022-01-24 NOTE — H&P (Signed)
History and Physical    JAEN SCHILDT ZOX:[id number] DOB: 16-May-1961 DOA: 01/24/2022  PCP: Darrow Bussing, MD   Patient coming from: Home   Chief Complaint: Petechiae, low platelets   HPI: Kenneth Juarez is a pleasant 61 y.o. male with medical history significant for recurrent ITP, 1 episode autoimmune hemolytic anemia in 2014, depression, anxiety, and hypertension, now presenting to the emergency department with 1 day petechial rash and prolonged bleeding after suffering a minor cut while shaving.  Patient reports noticing a petechia last night before going to bed, and then more widespread petechiae developing throughout the day.  He had a minor cut while shaving his face and noticed that it took much longer than usual for the bleeding to stop.  Symptoms were reminiscent of prior experience with ITP and prompted him to see his PCP for blood work.  He was notified that platelets were severely low and advised to seek evaluation and treatment in the ED.  He denies any headache, change in vision, focal numbness or weakness, melena, hematochezia, epistaxis, or bruising.  Denies any recent fevers or illness.  He recently received a shingles vaccine.  ED Course: Upon arrival to the ED, patient is found to be afebrile and saturating well on room air with stable blood pressure.  Blood work notable for creatinine 1.41 and platelets 10,000.  Oncology was consulted by the ED physician, the patient was treated with 125 mg of IV Solu-Medrol, and 1 g/kg IVIG was ordered.  Review of Systems:  All other systems reviewed and apart from HPI, are negative.  Past Medical History:  Diagnosis Date   Depression    Hemolytic anemia (HCC) 05/03/2013   Hypertension    ITP (idiopathic thrombocytopenic purpura)     History reviewed. No pertinent surgical history.  Social History:   reports that he quit smoking about 25 years ago. He has a 10.00 pack-year smoking history. He has never used smokeless  tobacco. He reports that he does not drink alcohol and does not use drugs.  No Known Allergies  History reviewed. No pertinent family history.   Prior to Admission medications   Medication Sig Start Date End Date Taking? Authorizing Provider  ALPRAZolam Prudy Feeler) 0.5 MG tablet Take 0.25 mg by mouth 2 (two) times daily as needed for anxiety. 08/05/18  Yes [provider]  Ascorbic Acid (VITAMIN C) 500 MG CAPS Take 500 mg by mouth daily.   Yes [provider]  atenolol (TENORMIN) 25 MG tablet Take 25 mg by mouth daily.   Yes [provider]  atorvastatin (LIPITOR) 20 MG tablet Take 20 mg by mouth daily. 12/07/21  Yes [provider]  cholecalciferol (VITAMIN D3) 25 MCG (1000 UT) tablet Take 1,000 Units by mouth daily.   Yes [provider]  lisinopril-hydrochlorothiazide (PRINZIDE,ZESTORETIC) 10-12.5 MG per tablet Take 1 tablet by mouth daily.   Yes [provider]  PARoxetine (PAXIL) 20 MG tablet Take 20 mg by mouth daily.   Yes [provider]    Physical Exam: Vitals:   01/24/22 2300 01/24/22 2315 01/24/22 2319 01/24/22 2345  BP: (!) 108/51 (!) 107/50  (!) 127/91  Pulse: 66 66  71  Resp: 17 14  18   Temp:   98 F (36.7 C) 99 F (37.2 C)  TempSrc:    Oral  SpO2: 97% 95%  96%  Weight:      Height:        Constitutional: NAD, calm  Eyes: PERTLA, lids and conjunctivae  normal ENMT: Mucous membranes are moist. Posterior pharynx clear of any exudate or lesions.   Neck: supple, no masses  Respiratory:  no wheezing, no crackles. No accessory muscle use.  Cardiovascular: S1 & S2 heard, regular rate and rhythm. No extremity edema.  Abdomen: No distension, no tenderness, soft. Bowel sounds active.  Musculoskeletal: no clubbing / cyanosis. No joint deformity upper and lower extremities.   Skin: Petechial rash. Warm, dry, well-perfused. Neurologic: CN 2-12 grossly intact. Sensation intact. Strength 5/5 in all 4 limbs. Alert and  oriented.  Psychiatric: Pleasant. Cooperative.    Labs and Imaging on Admission: I have personally reviewed following labs and imaging studies  CBC: Recent Labs  Lab 01/24/22 1830  WBC 6.9  NEUTROABS 3.9  HGB 15.5  HCT 45.3  MCV 87.1  PLT 10*   Basic Metabolic Panel: Recent Labs  Lab 01/24/22 1830  NA 135  K 3.5  CL 102  CO2 24  GLUCOSE 119*  BUN 13  CREATININE 1.41*  CALCIUM 9.3   GFR: Estimated Creatinine Clearance: 59.4 mL/min (A) (by C-G formula based on SCr of 1.41 mg/dL (H)). Liver Function Tests: No results for input(s): "AST", "ALT", "ALKPHOS", "BILITOT", "PROT", "ALBUMIN" in the last 168 hours. No results for input(s): "LIPASE", "AMYLASE" in the last 168 hours. No results for input(s): "AMMONIA" in the last 168 hours. Coagulation Profile: No results for input(s): "INR", "PROTIME" in the last 168 hours. Cardiac Enzymes: No results for input(s): "CKTOTAL", "CKMB", "CKMBINDEX", "TROPONINI" in the last 168 hours. BNP (last 3 results) No results for input(s): "PROBNP" in the last 8760 hours. HbA1C: No results for input(s): "HGBA1C" in the last 72 hours. CBG: No results for input(s): "GLUCAP" in the last 168 hours. Lipid Profile: No results for input(s): "CHOL", "HDL", "LDLCALC", "TRIG", "CHOLHDL", "LDLDIRECT" in the last 72 hours. Thyroid Function Tests: No results for input(s): "TSH", "T4TOTAL", "FREET4", "T3FREE", "THYROIDAB" in the last 72 hours. Anemia Panel: No results for input(s): "VITAMINB12", "FOLATE", "FERRITIN", "TIBC", "IRON", "RETICCTPCT" in the last 72 hours. Urine analysis: No results found for: "COLORURINE", "APPEARANCEUR", "LABSPEC", "PHURINE", "GLUCOSEU", "HGBUR", "BILIRUBINUR", "KETONESUR", "PROTEINUR", "UROBILINOGEN", "NITRITE", "LEUKOCYTESUR" Sepsis Labs: @LABRCNTIP (procalcitonin:4,lacticidven:4) )No results found for this or any previous visit (from the past 240 hour(s)).   Radiological Exams on Admission: No results  found.   Assessment/Plan  1. Thrombocytopenia   - Pt with hx of ITP in 2008, 2010, and 2020, and autoimmune hemolytic anemia in 2014 presents with 1 day of petechial rash and prolonged bleeding from minor cut while shaving  - He has isolated thrombocytopenia with platelets 10,000 in ED  - Appreciate hematology-oncology consultation, presentation most consistent with relapse of ITP and treatment with IVIG and IV Solu-Medrol recommended    2. Hypertension  - Continue atenolol; SCr appears up from baseline, will hold lisinopril-HCTZ   3. Depression, anxiety  - Continue Paxil and as-needed Xanax     DVT prophylaxis: SCDs  Code Status: Full  Level of Care:  Level of care: Telemetry Medical Family Communication: None present  Disposition Plan:  Patient is from: home  Anticipated d/c is to: Home  Anticipated d/c date is: Possibly as early as 8/26 or 01/26/22  Patient currently: Pending improvement in platelets  Consults called: Hematology-oncology  Admission status: Observation    Briscoe Deutscher, MD [facility]  01/25/2022, 1:57 AM

## 2022-01-24 NOTE — ED Provider Triage Note (Signed)
Emergency Medicine Provider Triage Evaluation Note  Kenneth Juarez , a 61 y.o. male  was evaluated in triage.  Pt complains of concerns for ITP flare.  Was evaluated by his hematologist and noted to have platelets in the 9000's.  Was told to come into the emergency department for lab redraw and consideration of infusion if needed.  No meds tried prior to arrival.  Denies bleeding.  Review of Systems  Positive: As per HPI Negative:   Physical Exam  BP (!) 156/84 (BP Location: Right Arm)   Pulse 83   Temp 99.3 F (37.4 C) (Oral)   Resp (!) 22   SpO2 97%  Gen:   Awake, no distress   Resp:  Normal effort  MSK:   Moves extremities without difficulty  Other:  Petechiae noted to oropharynx.  Small amounts of petechiae noted to bilateral forearms.  Medical Decision Making  Medically screening exam initiated at 6:11 PM.  Appropriate orders placed.  Leory Plowman was informed that the remainder of the evaluation will be completed by another provider, this initial triage assessment does not replace that evaluation, and the importance of remaining in the ED until their evaluation is complete.  Work-up initiated   Noretta Frier A, PA-C 01/24/22 1812

## 2022-01-24 NOTE — Consult Note (Signed)
New Hematology/Oncology Consult   Requesting MD: Sherwood Gambler         Reason for Consult: Thrombocytopenia  HPI: Kenneth Juarez has a history of recurrent ITP.  He developed petechiae yesterday.  He had prolonged bleeding after cutting himself shaving this morning.  He presented to his primary provider ports the platelet count was low.  He was referred to the [facility] emergency room.  No other bleeding.  He feels well.  No recent infection.  No fever.  He received the second Shingrix vaccine on 01/21/2022.  Initially diagnosed with ITP in 2008 recurrence in 2010.  He was treated for autoimmune hemolytic anemia in 2014.  He last had a relapse of an August 2020.  He was treated with IVIG and prednisone.  He entered remission.  He was tapered off of prednisone over actually 2 months.  The count returned at 322,000 on 05/11/2019.         Past Medical History:  Diagnosis Date   Depression/anxiety    Hemolytic anemia ([place]) 05/03/2013   Hypertension    ITP (idiopathic thrombocytopenic purpura) 2008, 2010, 2020  :  Surgical history: Wisdom tooth extraction   Current Facility-Administered Medications:    Immune Globulin 10% (PRIVIGEN) IV infusion 1 g/kg, 1 g/kg, Intravenous, Once, Sherwood Gambler, MD   methylPREDNISolone sodium succinate (SOLU-MEDROL) 125 mg/2 mL injection 125 mg, 125 mg, Intravenous, Daily, Sherwood Gambler, MD  Current Outpatient Medications:    ALPRAZolam (XANAX) 0.5 MG tablet, Take 0.5 mg by mouth 2 (two) times daily as needed for anxiety., Disp: , Rfl:    atenolol (TENORMIN) 25 MG tablet, Take 25 mg by mouth daily., Disp: , Rfl:    atorvastatin (LIPITOR) 10 MG tablet, Take 20 mg by mouth every evening. , Disp: , Rfl:    cholecalciferol (VITAMIN D3) 25 MCG (1000 UT) tablet, Take 1,000 Units by mouth daily., Disp: , Rfl:    lisinopril-hydrochlorothiazide (PRINZIDE,ZESTORETIC) 10-12.5 MG per tablet, Take 1 tablet by mouth daily., Disp: , Rfl:    PARoxetine (PAXIL)  20 MG tablet, Take 20 mg by mouth daily., Disp: , Rfl: :   methylPREDNISolone (SOLU-MEDROL) injection  125 mg Intravenous Daily  :  No Known Allergies:  FH: Father and great paternal aunt had ITP.  His mother had breast cancer.  SOCIAL HISTORY: Lives alone in [place].  He works for an IT consultant.  He does not smoke cigarettes.  Rare alcohol use.  He received a platelet transfusion in the past.  No Red cell transfusion.  Factor for HIV or hepatitis.  He has received COVID-19 vaccines.  Review of Systems:  Positives include: Petechiae over the extremities for the past 2 days, prolonged bleeding after cutting himself shaving today, benign positional vertigo approximately 1 month ago last episode 2 weeks ago  A complete ROS was otherwise negative.   Physical Exam:  Blood pressure (!) 156/84, pulse 73, temperature 99.3 F (37.4 C), temperature source Oral, resp. rate (!) 22, SpO2 96 %.  HEENT: Oral cavity without bleeding.  Mild white coat over the tongue Lungs: Clear bilaterally Cardiac: Regular rate and rhythm Abdomen: Hepatosplenomegaly  Vascular: Leg edema Lymph nodes: No cervical, supraclavicular, axillary, or inguinal nodes Neurologic: Alert and oriented, the motor exam appears intact in the upper and lower extremities bilaterally Skin: Petechiae to the arms and legs.  Without active bleeding at the neck and face Musculoskeletal: Mild tenderness  LABS:   Recent Labs    01/24/22 1830  WBC 6.9  HGB 15.5  HCT 45.3  PLT 10*   Blood smear: The polychromasia is not increased.  Few ovalocytes, rare teardrop.  Burr cells-artifact?.  The platelets are markedly decreased.  Most of the platelets are small.  No platelet clumps.  The majority the white cells are mature appearing neutrophils and lymphocytes.  Few band forms.  No blasts or other young forms are seen.  Recent Labs    01/24/22 1830  NA 135  K 3.5  CL 102  CO2 24  GLUCOSE 119*  BUN 13  CREATININE 1.41*   CALCIUM 9.3       Assessment and Plan:   Severe thrombocytopenia ITP initially diagnosed in 2008, relapse in 2010 and 2020 Clinical remission after IVIG and a prednisone taper in 2020  3.  Autoimmune hemolytic anemia in 2014  4.  Anxiety  5.  Hyperlipidemia  6.  Hypertension   Kenneth Juarez presents with petechiae and severe thrombocytopenia.  Hemoglobin and white count are normal.  The clinical presentation is consistent with a relapse of ITP.  There is no clear inciting event for the current relapse.  It is possible the ITP relapses related to the Shingrix vaccine.  On at least one case report of ITP following the Shingrix vaccine.  He has responded to IVIG and prednisone in the past.  I recommend giving him a dose of IVIG and starting Solu-Medrol.  He can be discharged home if he has a significant rise in the platelet count over the next 1-2 days.  We will consider administering a course of rituximab while he undergoes a prednisone taper.  This could lead to a more durable remission.   Recommendations: IVIG-1 g/kilogram daily for 2 doses, will consider discontinuing the second dose if the platelet count rises quickly Solu-Medrol 125 mg IV daily, convert to prednisone at discharge Prednisone taper as an outpatient Patient follow-up at the Cancer center center course of rituximab therapy as an outpatient  Betsy Coder, MD 01/24/2022, 8:57 PM

## 2022-01-25 DIAGNOSIS — D693 Immune thrombocytopenic purpura: Secondary | ICD-10-CM | POA: Diagnosis not present

## 2022-01-25 DIAGNOSIS — N179 Acute kidney failure, unspecified: Secondary | ICD-10-CM | POA: Diagnosis present

## 2022-01-25 DIAGNOSIS — E785 Hyperlipidemia, unspecified: Secondary | ICD-10-CM | POA: Diagnosis present

## 2022-01-25 DIAGNOSIS — D696 Thrombocytopenia, unspecified: Secondary | ICD-10-CM | POA: Diagnosis not present

## 2022-01-25 DIAGNOSIS — F418 Other specified anxiety disorders: Secondary | ICD-10-CM | POA: Diagnosis not present

## 2022-01-25 LAB — HEPATIC FUNCTION PANEL
ALT: 24 U/L (ref 0–44)
AST: 21 U/L (ref 15–41)
Albumin: 3.2 g/dL — ABNORMAL LOW (ref 3.5–5.0)
Alkaline Phosphatase: 51 U/L (ref 38–126)
Bilirubin, Direct: 0.3 mg/dL — ABNORMAL HIGH (ref 0.0–0.2)
Indirect Bilirubin: 1 mg/dL — ABNORMAL HIGH (ref 0.3–0.9)
Total Bilirubin: 1.3 mg/dL — ABNORMAL HIGH (ref 0.3–1.2)
Total Protein: 8.2 g/dL — ABNORMAL HIGH (ref 6.5–8.1)

## 2022-01-25 LAB — BASIC METABOLIC PANEL
Anion gap: 7 (ref 5–15)
BUN: 16 mg/dL (ref 8–23)
CO2: 22 mmol/L (ref 22–32)
Calcium: 9 mg/dL (ref 8.9–10.3)
Chloride: 101 mmol/L (ref 98–111)
Creatinine, Ser: 1.14 mg/dL (ref 0.61–1.24)
GFR, Estimated: >60 mL/min (ref >60)
Glucose, Bld: 136 mg/dL — ABNORMAL HIGH (ref 70–99)
Potassium: 4.1 mmol/L (ref 3.5–5.1)
Sodium: 130 mmol/L — ABNORMAL LOW (ref 135–145)

## 2022-01-25 LAB — HIV ANTIBODY (ROUTINE TESTING W REFLEX): HIV Screen 4th Generation wRfx: NONREACTIVE

## 2022-01-25 LAB — RETICULOCYTES
Immature Retic Fract: 13.2 % (ref 2.3–15.9)
RBC.: 4.68 MIL/uL (ref 4.22–5.81)
Retic Count, Absolute: 68.8 10*3/uL (ref 19.0–186.0)
Retic Ct Pct: 1.5 % (ref 0.4–3.1)

## 2022-01-25 LAB — LACTATE DEHYDROGENASE: LDH: 169 U/L (ref 98–192)

## 2022-01-25 NOTE — Progress Notes (Signed)
IP PROGRESS NOTE  Subjective:   Dr. Lonia Chimera reports tolerating the IVIG well.  He continues to have petechiae.  No other bleeding.  No complaint.  Objective: Vital signs in last 24 hours: Blood pressure 116/66, pulse 79, temperature 98.7 F (37.1 C), temperature source Oral, resp. rate 17, height '5\' 7"'$  (1.702 m), weight 201 lb 12.8 oz (91.5 kg), SpO2 97 %.  Intake/Output from previous day: No intake/output data recorded.  Physical Exam:  HEENT: No thrush or bleeding Lungs: Clear bilaterally Cardiac: Regular rate and rhythm Abdomen: No hepatosplenomegaly Skin: Petechiae over the scalp, trunk, and extremities   Portacath/PICC-without erythema  Lab Results: Recent Labs    01/24/22 1830 01/25/22 0631  WBC 6.9 7.0  HGB 15.5 14.1  HCT 45.3 39.8  PLT 10* 18*    BMET Recent Labs    01/24/22 1830 01/25/22 0631  NA 135 130*  K 3.5 4.1  CL 102 101  CO2 24 22  GLUCOSE 119* 136*  BUN 13 16  CREATININE 1.41* 1.14  CALCIUM 9.3 9.0     Medications: I have reviewed the patient's current medications.  Assessment/Plan: Severe thrombocytopenia ITP initially diagnosed in 2008, relapse in 2010 and 2020 Clinical remission after IVIG and a prednisone taper in 2020  3.  Autoimmune hemolytic anemia in 2014  4.  Anxiety  5.  Hyperlipidemia  6.  Hypertension   Mr Baldus appears stable.  The platelet count is higher today.  He will receive another dose of IVIG today.  He will continue Solu-Medrol today.  He can be discharged to home if the platelet count is higher tomorrow.  We will arrange for outpatient follow-up in the hematology clinic next week.  The bilirubin is mildly elevated with elevation of the indirect bilirubin.  He could have a early hemolysis.  He has a history of autoimmune hemolysis.  I will check the LDH, reticulocyte count, and DAT.  He can be discharged on prednisone at a dose of 80 mg daily if the platelet count is higher tomorrow.    LOS: 0 days    Betsy Coder, MD   01/25/2022, 10:04 AM

## 2022-01-25 NOTE — Progress Notes (Signed)
[facility]                                                                              Oluwadamilare Tobler, is a 61 y.o. male, DOB - [birth year], ZDG:[id number] Admit date - 01/24/2022    Outpatient Primary MD for the patient is Koirala, Dibas, MD  LOS - 0  days  Chief Complaint  Patient presents with   Abnormal Lab       Brief summary   Patient is a 61 year old male with recurrent ITP, 1 episode of autoimmune hemolytic anemia in 2014, depression/anxiety, HTN, HLP presented to ED with 1 day petechial rash and prolonged bleeding after suffering a minor cut while shaving.  Patient reported petechiae at night before going to bed and then more widespread petechiae developing throughout the day.  He had a minor cut while shaving and noticed that it took much longer than usual for the bleeding to stop.  Symptoms were reminiscent of prior experience with ITP, prompted him to see his PCP. CBC showed platelets 10K, Cr 1.41 Oncology was consulted   Assessment & Plan    Principal Problem: Acute thrombocytopenia ([place]), recurrent episodes of ITP -Patient has a history of ITP in 2008, 2010, 2020 and oral immune hemolytic anemia in 2014 -Platelets 18 K, improving, currently on IVIG and IV Solu-Medrol -Hematology following  Active Problems:   Depression with anxiety -Currently stable, continue Paxil, as needed Xanax    Essential hypertension -BP stable, continue atenolol  Acute kidney injury with hyponatremia -Creatinine 1.41 on admission -Continue to hold HCTZ, creatinine improving, 1.1     Hyperlipidemia -Continue statin  Obesity Estimated body mass index is 31.61 kg/m as calculated from the following:   Height as of this encounter: '5\' 7"'$  (1.702 m).   Weight as of this encounter: 91.5 kg.  Code Status: Full CODE STATUS DVT Prophylaxis:  SCDs Start: 01/24/22 2146   Level of Care: Level of care: Telemetry Medical Family Communication: Updated  patient   Disposition Plan:      Remains inpatient appropriate: Hopefully for DC home tomorrow if platelets continue to improve  Procedures:  None  Consultants:   Hemonc  Antimicrobials:   Anti-infectives (From admission, onward)    None          Medications  atenolol  25 mg Oral Daily   atorvastatin  20 mg Oral Daily   methylPREDNISolone (SOLU-MEDROL) injection  125 mg Intravenous Daily   PARoxetine  20 mg Oral Daily   sodium chloride flush  3 mL Intravenous Q12H      Subjective:   Ruben Gottron was seen and examined today.  No acute complaints, no bleeding episodes. Patient denies dizziness, chest pain, shortness of breath, abdominal pain, N/V.  No fevers  Objective:   Vitals:   01/25/22 0243 01/25/22 0259 01/25/22 0300 01/25/22 0823  BP: 138/79 (!) 141/79 128/77 116/66  Pulse: 82 82 78 79  Resp:    17  Temp:    98.7 F (37.1 C)  TempSrc:    Oral  SpO2:    97%  Weight:      Height:  No intake or output data in the 24 hours ending 01/25/22 1145   Wt Readings from Last 3 Encounters:  01/24/22 91.5 kg  05/11/19 100.7 kg  03/10/19 98 kg     Exam General: Alert and oriented x 3, NAD Cardiovascular: S1 S2 auscultated,  RRR Respiratory: Clear to auscultation bilaterally Gastrointestinal: Soft, nontender, nondistended, + bowel sounds Ext: no pedal edema bilaterally Neuro: no new FND's Skin: petechial rash Psych: Normal affect and demeanor, alert and oriented x3     Data Reviewed:  I have personally reviewed following labs    CBC Lab Results  Component Value Date   WBC 7.0 01/25/2022   RBC 4.66 01/25/2022   RBC 4.68 01/25/2022   HGB 14.1 01/25/2022   HCT 39.8 01/25/2022   MCV 85.4 01/25/2022   MCH 30.3 01/25/2022   PLT 18 (LL) 01/25/2022   MCHC 35.4 01/25/2022   RDW 12.0 01/25/2022   LYMPHSABS 0.5 (L) 01/25/2022   MONOABS 0.3 01/25/2022   EOSABS 0.0 01/25/2022   BASOSABS 0.0 74/05/8785     Last metabolic panel Lab  Results  Component Value Date   NA 130 (L) 01/25/2022   K 4.1 01/25/2022   CL 101 01/25/2022   CO2 22 01/25/2022   BUN 16 01/25/2022   CREATININE 1.14 01/25/2022   GLUCOSE 136 (H) 01/25/2022   GFRNONAA >60 01/25/2022   GFRAA >60 03/10/2019   CALCIUM 9.0 01/25/2022   PROT 8.2 (H) 01/25/2022   ALBUMIN 3.2 (L) 01/25/2022   BILITOT 1.3 (H) 01/25/2022   ALKPHOS 51 01/25/2022   AST 21 01/25/2022   ALT 24 01/25/2022   ANIONGAP 7 01/25/2022       Gennie Dib M.D. [facility] 01/25/2022, 11:45 AM  Available via Epic secure chat 7am-7pm After 7 pm, please refer to night coverage provider listed on amion.

## 2022-01-25 NOTE — Progress Notes (Signed)
  Transition of Care [facility]) Screening Note   Patient Details  Name: Kenneth Juarez Date of Birth: Apr 01, 1961   Transition of Care [facility]) CM/SW Contact:    Bartholomew Crews, RN Phone Number: [phone] 01/25/2022, 11:41 AM    Transition of Care Department [facility]) has reviewed patient and no TOC needs have been identified at this time. We will continue to monitor patient advancement through interdisciplinary progression rounds. If new patient transition needs arise, please place a TOC consult.

## 2022-01-25 NOTE — Progress Notes (Signed)
Pt admitted for thrombocytopenia. Pt A&OX4, no c/o pain. Pt on tele NSR , no skin issues. IVIG started. BP (!) 141/79 (BP Location: Left Arm)   Pulse 82   Temp 99 F (37.2 C) (Oral)   Resp 18   Ht '5\' 7"'$  (1.702 m)   Wt 91.5 kg   SpO2 96%   BMI 31.61 kg/m  Bed in lowest position, bed locked, 2/4 rails up. Call bell nearby. No needs voiced at this time. [name] 01/25/22 3:14 AM

## 2022-01-26 DIAGNOSIS — N179 Acute kidney failure, unspecified: Secondary | ICD-10-CM | POA: Diagnosis not present

## 2022-01-26 DIAGNOSIS — D693 Immune thrombocytopenic purpura: Secondary | ICD-10-CM | POA: Diagnosis not present

## 2022-01-26 DIAGNOSIS — D696 Thrombocytopenia, unspecified: Secondary | ICD-10-CM | POA: Diagnosis not present

## 2022-01-26 DIAGNOSIS — F418 Other specified anxiety disorders: Secondary | ICD-10-CM | POA: Diagnosis not present

## 2022-01-26 LAB — CBC WITH DIFFERENTIAL/PLATELET
Abs Immature Granulocytes: 0.07 10*3/uL (ref 0.00–0.07)
Abs Immature Granulocytes: 0.04 10*3/uL (ref 0.00–0.07)
Basophils Absolute: 0 10*3/uL (ref 0.0–0.1)
Basophils Absolute: 0 10*3/uL (ref 0.0–0.1)
Basophils Relative: 0 %
Basophils Relative: 0 %
Eosinophils Absolute: 0 10*3/uL (ref 0.0–0.5)
Eosinophils Absolute: 0 10*3/uL (ref 0.0–0.5)
Eosinophils Relative: 0 %
Eosinophils Relative: 0 %
HCT: 36.7 % — ABNORMAL LOW (ref 39.0–52.0)
HCT: 39.8 % (ref 39.0–52.0)
Hemoglobin: 12.4 g/dL — ABNORMAL LOW (ref 13.0–17.0)
Hemoglobin: 14.1 g/dL (ref 13.0–17.0)
Immature Granulocytes: 1 %
Immature Granulocytes: 1 %
Lymphocytes Relative: 7 %
Lymphocytes Relative: 7 %
Lymphs Abs: 0.6 10*3/uL — ABNORMAL LOW (ref 0.7–4.0)
Lymphs Abs: 0.5 10*3/uL — ABNORMAL LOW (ref 0.7–4.0)
MCH: 29.6 pg (ref 26.0–34.0)
MCH: 30.3 pg (ref 26.0–34.0)
MCHC: 33.8 g/dL (ref 30.0–36.0)
MCHC: 35.4 g/dL (ref 30.0–36.0)
MCV: 87.6 fL (ref 80.0–100.0)
MCV: 85.4 fL (ref 80.0–100.0)
Monocytes Absolute: 0.9 10*3/uL (ref 0.1–1.0)
Monocytes Absolute: 0.3 10*3/uL (ref 0.1–1.0)
Monocytes Relative: 10 %
Monocytes Relative: 4 %
Neutro Abs: 7.1 10*3/uL (ref 1.7–7.7)
Neutro Abs: 6.3 10*3/uL (ref 1.7–7.7)
Neutrophils Relative %: 82 %
Neutrophils Relative %: 88 %
Platelets: 33 10*3/uL — ABNORMAL LOW (ref 150–400)
Platelets: 18 10*3/uL — CL (ref 150–400)
RBC: 4.19 MIL/uL — ABNORMAL LOW (ref 4.22–5.81)
RBC: 4.66 MIL/uL (ref 4.22–5.81)
RDW: 12.2 % (ref 11.5–15.5)
RDW: 12 % (ref 11.5–15.5)
WBC: 8.7 10*3/uL (ref 4.0–10.5)
WBC: 7 10*3/uL (ref 4.0–10.5)
nRBC: 0 % (ref 0.0–0.2)
nRBC: 0 % (ref 0.0–0.2)

## 2022-01-26 LAB — COMPREHENSIVE METABOLIC PANEL
ALT: 20 U/L (ref 0–44)
AST: 17 U/L (ref 15–41)
Albumin: 2.7 g/dL — ABNORMAL LOW (ref 3.5–5.0)
Alkaline Phosphatase: 44 U/L (ref 38–126)
Anion gap: 6 (ref 5–15)
BUN: 21 mg/dL (ref 8–23)
CO2: 21 mmol/L — ABNORMAL LOW (ref 22–32)
Calcium: 8.7 mg/dL — ABNORMAL LOW (ref 8.9–10.3)
Chloride: 100 mmol/L (ref 98–111)
Creatinine, Ser: 1.11 mg/dL (ref 0.61–1.24)
GFR, Estimated: >60 mL/min (ref >60)
Glucose, Bld: 148 mg/dL — ABNORMAL HIGH (ref 70–99)
Potassium: 4 mmol/L (ref 3.5–5.1)
Sodium: 127 mmol/L — ABNORMAL LOW (ref 135–145)
Total Bilirubin: 0.7 mg/dL (ref 0.3–1.2)
Total Protein: 8 g/dL (ref 6.5–8.1)

## 2022-01-26 LAB — OSMOLALITY: Osmolality: 285 mOsm/kg (ref 275–295)

## 2022-01-26 MED ORDER — PREDNISONE 20 MG PO TABS: 80.0000 mg | ORAL_TABLET | Freq: Every day | ORAL | 1 refills | Status: DC

## 2022-01-26 MED ORDER — LISINOPRIL-HYDROCHLOROTHIAZIDE 10-12.5 MG PO TABS: 1.0000 | ORAL_TABLET | Freq: Every day | ORAL | Status: AC

## 2022-01-26 MED ORDER — PREDNISONE 50 MG PO TABS
80.0000 mg | ORAL_TABLET | Freq: Every day | ORAL | Status: DC
  Administered 2022-01-26: 80 mg via ORAL
  Filled 2022-01-26: qty 1

## 2022-01-26 MED ORDER — PANTOPRAZOLE SODIUM 40 MG PO TBEC: 40.0000 mg | DELAYED_RELEASE_TABLET | Freq: Every day | ORAL | 3 refills | Status: AC

## 2022-01-26 MED ORDER — METHYLPREDNISOLONE SODIUM SUCC 125 MG IJ SOLR: 125.0000 mg | Freq: Every day | INTRAMUSCULAR | Status: DC

## 2022-01-26 NOTE — Progress Notes (Signed)
Discharge instructions given at this time. Pt remains alert and oriented and stable at baseline. He will be transported off unit with all belongings once his ride has arrived.

## 2022-01-26 NOTE — Discharge Summary (Signed)
Physician Discharge Summary   Patient: Kenneth Juarez MRN: [id number] DOB: 05/08/1961  Admit date:     01/24/2022  Discharge date: 01/26/22  Discharge Physician: Estill Cotta, MD    PCP: [name], MD   Recommendations at discharge:   Continue prednisone 80 mg daily until follow-up with Dr. Sherril/oncology.  Further taper at the follow-up  Discharge Diagnoses:  Acute thrombocytopenia ([place]), secondary to ITP    Depression with anxiety   Essential hypertension   Hyperlipidemia   AKI (acute kidney injury) [facility])    Hospital Course:  Patient is a 61 year old male with recurrent ITP, 1 episode of autoimmune hemolytic anemia in 2014, depression/anxiety, HTN, HLP presented to ED with 1 day petechial rash and prolonged bleeding after suffering a minor cut while shaving.  Patient reported petechiae at night before going to bed and then more widespread petechiae developing throughout the day.  He had a minor cut while shaving and noticed that it took much longer than usual for the bleeding to stop.  Symptoms were reminiscent of prior experience with ITP, prompted him to see his PCP. CBC showed platelets 10K, Cr 1.41 Oncology was consulted    Assessment and Plan:  Acute thrombocytopenia ([place]), recurrent episodes of ITP -Patient has a history of ITP in 2008, 2010, 2020 and oral immune hemolytic anemia in 2014 -Patient was placed on IVIG and IV Solu-Medrol, received 2 doses of IVIG. -Hematology was consulted, seen by Dr. Ammie Dalton.  Platelets improved to 33 K, transition to oral prednisone.  Dr. Ammie Dalton recommended continue prednisone 80 mg daily and further taper at the follow-up appointment at hematology clinic   Active Problems:   Depression with anxiety -Currently stable, continue Paxil, as needed Xanax     Essential hypertension -BP stable, continue atenolol.  Hold off lisinopril/HCTZ, explained to the patient   Acute kidney injury with hyponatremia -Creatinine 1.41 on  admission -Sodium still 127, asymptomatic, tolerating diet, creatinine improved to 1.1 -BP stable, recommended to hold off on lisinopril/HCTZ     Hyperlipidemia -Continue statin   Obesity Estimated body mass index is 31.61 kg/m as calculated from the following:   Height as of this encounter: '5\' 7"'$  (1.702 m).   Weight as of this encounter: 91.5 kg.       Pain control - [employer] Controlled Substance Reporting System database was reviewed. and patient was instructed, not to drive, operate heavy machinery, perform activities at heights, swimming or participation in water activities or provide baby-sitting services while on Pain, Sleep and Anxiety Medications; until their outpatient Physician has advised to do so again. Also recommended to not to take more than prescribed Pain, Sleep and Anxiety Medications.  Consultants: Oncology, Dr. Ammie [place] Procedures performed: None Disposition: Home Diet recommendation:  Discharge Diet Orders (From admission, onward)     Start     Ordered   01/26/22 0000  Diet general        01/26/22 0933           Regular diet DISCHARGE MEDICATION: Allergies as of 01/26/2022   No Known Allergies      Medication List     TAKE these medications    ALPRAZolam 0.5 MG tablet Commonly known as: XANAX Take 0.25 mg by mouth 2 (two) times daily as needed for anxiety.   atenolol 25 MG tablet Commonly known as: TENORMIN Take 25 mg by mouth daily.   atorvastatin 20 MG tablet Commonly known as: LIPITOR Take 20 mg by mouth daily.   cholecalciferol 25  MCG (1000 UNIT) tablet Commonly known as: VITAMIN D3 Take 1,000 Units by mouth daily.   lisinopril-hydrochlorothiazide 10-12.5 MG tablet Commonly known as: ZESTORETIC Take 1 tablet by mouth daily. HOLD until follow up with your doctor or BP consistently high What changed: additional instructions   pantoprazole 40 MG tablet Commonly known as: Protonix Take 1 tablet (40 mg total) by mouth daily.    PARoxetine 20 MG tablet Commonly known as: PAXIL Take 20 mg by mouth daily.   predniSONE 20 MG tablet Commonly known as: DELTASONE Take 4 tablets (80 mg total) by mouth daily with breakfast. Start taking on: January 27, 2022   Vitamin C 500 MG Caps Take 500 mg by mouth daily.        Follow-up Information     Ladell Pier, MD. Schedule an appointment as soon as possible for a visit in 2 week(s).   Specialty: Oncology Why: for hospital follow-up, obtain labs CBC, BMET Contact information: [place] [address] [phone]         [name], MD. Schedule an appointment as soon as possible for a visit in 2 week(s).   Specialty: Family Medicine Why: for hospital follow-up Contact information: [place] [id number] [phone]         Nicholas Lose, MD .   Specialty: Hematology and Oncology Contact information: [place] [address] [phone]                Discharge Exam: Danley Danker Weights   01/24/22 2110  Weight: 91.5 kg   S: No acute complaints, looking forward to go home today, platelets improved to 33 K.   Vitals:   01/25/22 2256 01/26/22 0157 01/26/22 0439 01/26/22 0817  BP: 120/71 116/66 (!) 110/59 117/68  Pulse: 64 (!) 58 61 (!) 57  Resp:  '18 16 16  '$ Temp:   98.4 F (36.9 C) 98.5 F (36.9 C)  TempSrc:   Oral Oral  SpO2:   98% 96%  Weight:      Height:        Physical Exam General: Alert and oriented x 3, NAD Cardiovascular: S1 S2 clear, RRR.  Respiratory: CTAB, no wheezing, rales or rhonchi Gastrointestinal: Soft, nontender, nondistended, NBS Ext: no pedal edema bilaterally Neuro: no new deficits Skin: petechiae fading  Condition at discharge: good  The results of significant diagnostics from this hospitalization (including imaging, microbiology, ancillary and laboratory) are listed below for reference.   Imaging Studies: No  results found.  Microbiology: Results for orders placed or performed during the hospital encounter of 01/26/19  SARS CORONAVIRUS 2 (TAT 6-12 HRS)     Status: None   Collection Time: 01/27/19  2:30 AM  Result Value Ref Range Status   SARS Coronavirus 2 NEGATIVE NEGATIVE Final    Comment: (NOTE) SARS-CoV-2 target nucleic acids are NOT DETECTED. The SARS-CoV-2 RNA is generally detectable in upper and lower respiratory specimens during the acute phase of infection. Negative results do not preclude SARS-CoV-2 infection, do not rule out co-infections with other pathogens, and should not be used as the sole basis for treatment or other patient management decisions. Negative results must be combined with clinical observations, patient history, and epidemiological information. The expected result is Negative. Fact Sheet for Patients: SugarRoll.be Fact Sheet for Healthcare Providers: https://www.woods-mathews.com/ This test is not yet approved or cleared by the [place] FDA and  has been authorized for detection and/or diagnosis of SARS-CoV-2 by  FDA under an Emergency Use Authorization (EUA). This EUA will remain  in effect (meaning this test can be used) for the duration of the COVID-19 declaration under Section 56 4(b)(1) of the Act, 21 U.S.C. section 360bbb-3(b)(1), unless the authorization is terminated or revoked sooner. Performed at [facility], [place] [address] [place], [place] [address]     Labs: CBC: Recent Labs  Lab 01/24/22 1830 01/25/22 0631 01/26/22 0048  WBC 6.9 7.0 8.7  NEUTROABS 3.9 6.3 7.1  HGB 15.5 14.1 12.4*  HCT 45.3 39.8 36.7*  MCV 87.1 85.4 87.6  PLT 10* 18* 33*   Basic Metabolic Panel: Recent Labs  Lab 01/24/22 1830 01/25/22 0631 01/26/22 0048  NA 135 130* 127*  K 3.5 4.1 4.0  CL 102 101 100  CO2 24 22 21*  GLUCOSE 119* 136* 148*  BUN '13 16 21  '$ CREATININE 1.41* 1.14 1.11  CALCIUM 9.3 9.0 8.7*    Liver Function Tests: Recent Labs  Lab 01/25/22 0631 01/26/22 0048  AST 21 17  ALT 24 20  ALKPHOS 51 44  BILITOT 1.3* 0.7  PROT 8.2* 8.0  ALBUMIN 3.2* 2.7*   CBG: No results for input(s): "GLUCAP" in the last 168 hours.  Discharge time spent: greater than 30 minutes.  Signed: Estill Cotta, MD [facility] 01/26/2022

## 2022-01-26 NOTE — Progress Notes (Signed)
IP PROGRESS NOTE  Subjective:   Dr. Lonia Chimera reports the petechiae are fading.  No new complaint.  Objective: Vital signs in last 24 hours: Blood pressure (!) 110/59, pulse 61, temperature 98.4 F (36.9 C), temperature source Oral, resp. rate 16, height '5\' 7"'$  (1.702 m), weight 201 lb 12.8 oz (91.5 kg), SpO2 98 %.  Intake/Output from previous day: No intake/output data recorded.  Physical Exam:  HEENT: No thrush or bleeding  Skin: Fewer petechiae over the scalp, trunk, and extremities   Lab Results: Recent Labs    01/25/22 0631 01/26/22 0048  WBC 7.0 8.7  HGB 14.1 12.4*  HCT 39.8 36.7*  PLT 18* 33*    BMET Recent Labs    01/25/22 0631 01/26/22 0048  NA 130* 127*  K 4.1 4.0  CL 101 100  CO2 22 21*  GLUCOSE 136* 148*  BUN 16 21  CREATININE 1.14 1.11  CALCIUM 9.0 8.7*  Total bilirubin 0.7 on 01/26/2022 LDH 169 on 01/25/2022   Medications: I have reviewed the patient's current medications.  Assessment/Plan: Severe thrombocytopenia ITP initially diagnosed in 2008, relapse in 2010 and 2020 Clinical remission after IVIG and a prednisone taper in 2020 Admission with relapse of TTP 01/24/2022-IVIG daily x2, Solu-Medrol daily x2, discharged on prednisone 80 mg daily 01/26/2022  3.  Autoimmune hemolytic anemia in 2014 DAT positive for IgG and complement 01/26/2022  4.  Anxiety  5.  Hyperlipidemia  6.  Hypertension   Mr Laverdure appears stable.  He tolerated the IVIG well.  The platelet count is higher.  He appears stable for discharge.  I recommend continuing prednisone at a dose of 80 mg daily.  We will arrange for outpatient follow-up at the Cancer center this week.  The plan is to taper the prednisone over the next few months.  He has mild anemia and a positive DAT.  He does not appear to have significant hemolysis at present.  The anemia is likely related to multiple phlebotomies during this admission.  We will monitor the hemoglobin as an outpatient.   LOS: 0  days   Betsy Coder, MD   01/26/2022, 7:32 AM

## 2022-01-27 ENCOUNTER — Telehealth: Payer: Self-pay | Admitting: Oncology

## 2022-01-27 ENCOUNTER — Telehealth: Payer: Self-pay | Admitting: Hematology and Oncology

## 2022-01-27 NOTE — Telephone Encounter (Signed)
Attempted to contact patient in regards to schedule message no answer so voicemail was left

## 2022-01-27 NOTE — Telephone Encounter (Signed)
Scheduled appt per 8/28 referral. Pt is aware of appt date and time. Pt is aware to arrive 15 mins prior to appt time and to bring and updated insurance card. Pt is aware of appt location.   

## 2022-01-28 ENCOUNTER — Telehealth: Payer: Self-pay | Admitting: Oncology

## 2022-01-28 NOTE — Telephone Encounter (Signed)
Reached out to patient in regards to scheduling message to schedule follow up lab/office but patient advise that he was actually going to be following up with Dr. Lindi Adie at [facility].

## 2022-01-29 LAB — DIRECT ANTIGLOBULIN TEST (NOT AT ARMC)
DAT, IgG: POSITIVE
DAT, complement: NEGATIVE

## 2022-01-30 ENCOUNTER — Inpatient Hospital Stay: Payer: No Typology Code available for payment source | Attending: Adult Health

## 2022-01-30 ENCOUNTER — Other Ambulatory Visit: Payer: Self-pay

## 2022-01-30 ENCOUNTER — Other Ambulatory Visit: Payer: Self-pay | Admitting: *Deleted

## 2022-01-30 ENCOUNTER — Inpatient Hospital Stay: Payer: No Typology Code available for payment source | Admitting: Adult Health

## 2022-01-30 VITALS — BP 144/77 | HR 72 | Temp 98.1°F | Resp 16 | Ht 67.0 in | Wt 202.1 lb

## 2022-01-30 DIAGNOSIS — N179 Acute kidney failure, unspecified: Secondary | ICD-10-CM | POA: Diagnosis not present

## 2022-01-30 DIAGNOSIS — Z87891 Personal history of nicotine dependence: Secondary | ICD-10-CM | POA: Insufficient documentation

## 2022-01-30 DIAGNOSIS — I10 Essential (primary) hypertension: Secondary | ICD-10-CM | POA: Insufficient documentation

## 2022-01-30 DIAGNOSIS — D589 Hereditary hemolytic anemia, unspecified: Secondary | ICD-10-CM | POA: Diagnosis not present

## 2022-01-30 DIAGNOSIS — R233 Spontaneous ecchymoses: Secondary | ICD-10-CM | POA: Insufficient documentation

## 2022-01-30 DIAGNOSIS — E785 Hyperlipidemia, unspecified: Secondary | ICD-10-CM | POA: Insufficient documentation

## 2022-01-30 DIAGNOSIS — D693 Immune thrombocytopenic purpura: Secondary | ICD-10-CM

## 2022-01-30 DIAGNOSIS — D696 Thrombocytopenia, unspecified: Secondary | ICD-10-CM | POA: Diagnosis not present

## 2022-01-30 LAB — CBC WITH DIFFERENTIAL (CANCER CENTER ONLY)
Abs Immature Granulocytes: 0.06 10*3/uL (ref 0.00–0.07)
Basophils Absolute: 0 10*3/uL (ref 0.0–0.1)
Basophils Relative: 0 %
Eosinophils Absolute: 0 10*3/uL (ref 0.0–0.5)
Eosinophils Relative: 0 %
HCT: 40.4 % (ref 39.0–52.0)
Hemoglobin: 14.2 g/dL (ref 13.0–17.0)
Immature Granulocytes: 1 %
Lymphocytes Relative: 9 %
Lymphs Abs: 0.7 10*3/uL (ref 0.7–4.0)
MCH: 29.8 pg (ref 26.0–34.0)
MCHC: 35.1 g/dL (ref 30.0–36.0)
MCV: 84.7 fL (ref 80.0–100.0)
Monocytes Absolute: 0.3 10*3/uL (ref 0.1–1.0)
Monocytes Relative: 3 %
Neutro Abs: 6.8 10*3/uL (ref 1.7–7.7)
Neutrophils Relative %: 87 %
Platelet Count: 278 10*3/uL (ref 150–400)
RBC: 4.77 MIL/uL (ref 4.22–5.81)
RDW: 12.4 % (ref 11.5–15.5)
WBC Count: 7.8 10*3/uL (ref 4.0–10.5)
nRBC: 0 % (ref 0.0–0.2)

## 2022-01-30 MED ORDER — PREDNISONE 20 MG PO TABS: 60.0000 mg | ORAL_TABLET | Freq: Every day | ORAL | 1 refills | Status: DC

## 2022-01-30 NOTE — Progress Notes (Signed)
[facility] Follow up:    Kenneth Amel, MD [place] [address] [address]  Diagnosis: Acute ITP  SUMMARY OF HEMATOLOGIC HISTORY: Three episodes of ITP in 2008, 2010, 2020 receiving IVIG and Prednisone in the past One episode of hemolytic anemia in 2014 treated with steroids  CURRENT THERAPY: Prednisone taper, s/p IVIG  INTERVAL HISTORY: Kenneth Juarez 61 y.o. male returns for follow-up of his recent ITP diagnosis.  He received his shingles vaccine on Tuesday, August 22.  He tells me that on August 24 he began to develop petechiae.  The following day the petechiae worsened and he saw his primary care provider and was informed that his platelet count was 10.  He was instructed to go to the emergency room and he received IVIG on August 26.  He tolerated this treatment well and began a steroid taper.  He tells me that upon his discharge he received 2 different recommendations about how to take his prednisone.  1 recommendation was 80 mg daily but other recommendation was 40 mg daily.  He decided to split the difference and has been taking 60 mg in the morning with food with good tolerance.  Today his platelet count is 278.   Patient Active Problem List   Diagnosis Date Noted   Hyperlipidemia 01/25/2022   AKI (acute kidney injury) ([place]) 01/25/2022   Acute ITP ([place]) 01/27/2019   Depression with anxiety    Essential hypertension    Thrombocytopenia ([place]) 05/03/2013   Hemolytic anemia ([place]) 05/03/2013    has No Known Allergies.  MEDICAL HISTORY: Past Medical History:  Diagnosis Date   Depression    Hemolytic anemia ([place]) 05/03/2013   Hypertension    ITP (idiopathic thrombocytopenic purpura)     SURGICAL HISTORY: No past surgical history on file.  SOCIAL HISTORY: Social History   Socioeconomic History   Marital status: Single    Spouse name: Not on file   Number of children: Not on file   Years of education: Not on file    Highest education level: Not on file  Occupational History   Not on file  Tobacco Use   Smoking status: Former    Packs/day: 2.00    Years: 5.00    Total pack years: 10.00    Types: Cigarettes    Quit date: 06/02/1996    Years since quitting: 25.6   Smokeless tobacco: Never  Substance and Sexual Activity   Alcohol use: No   Drug use: No   Sexual activity: Yes  Other Topics Concern   Not on file  Social History Narrative   Not on file   Social Determinants of Health   Financial Resource Strain: Not on file  Food Insecurity: Not on file  Transportation Needs: Not on file  Physical Activity: Not on file  Stress: Not on file  Social Connections: Not on file  Intimate Partner Violence: Not on file    FAMILY HISTORY: Noncontributory  Review of Systems  Constitutional:  Negative for appetite change, chills, fatigue, fever and unexpected weight change.  HENT:   Negative for hearing loss, lump/mass and trouble swallowing.   Eyes:  Negative for eye problems and icterus.  Respiratory:  Negative for chest tightness, cough and shortness of breath.   Cardiovascular:  Negative for chest pain, leg swelling and palpitations.  Gastrointestinal:  Negative for abdominal distention, abdominal pain, constipation, diarrhea, nausea and vomiting.  Endocrine: Negative for hot flashes.  Genitourinary:  Negative for  difficulty urinating.   Musculoskeletal:  Negative for arthralgias.  Skin:  Negative for itching and rash.  Neurological:  Negative for dizziness, extremity weakness, headaches and numbness.  Hematological:  Negative for adenopathy. Does not bruise/bleed easily.  Psychiatric/Behavioral:  Negative for depression. The patient is not nervous/anxious.       PHYSICAL EXAMINATION  ECOG PERFORMANCE STATUS: 0 - Asymptomatic  Vitals:   01/30/22 1506  BP: (!) 144/77  Pulse: 72  Resp: 16  Temp: 98.1 F (36.7 C)  SpO2: 97%    Physical Exam Constitutional:      General: He is not  in acute distress.    Appearance: Normal appearance. He is not toxic-appearing.  HENT:     Head: Normocephalic and atraumatic.  Eyes:     General: No scleral icterus. Cardiovascular:     Rate and Rhythm: Normal rate and regular rhythm.     Pulses: Normal pulses.     Heart sounds: Normal heart sounds.  Pulmonary:     Effort: Pulmonary effort is normal.     Breath sounds: Normal breath sounds.  Abdominal:     General: Abdomen is flat. Bowel sounds are normal. There is no distension.     Palpations: Abdomen is soft.     Tenderness: There is no abdominal tenderness.  Musculoskeletal:        General: No swelling.     Cervical back: Neck supple.  Lymphadenopathy:     Cervical: No cervical adenopathy.  Skin:    General: Skin is warm and dry.     Findings: No rash.  Neurological:     General: No focal deficit present.     Mental Status: He is alert.  Psychiatric:        Mood and Affect: Mood normal.        Behavior: Behavior normal.     LABORATORY DATA:  CBC    Component Value Date/Time   WBC 7.8 01/30/2022 1446   WBC 8.7 01/26/2022 0048   RBC 4.77 01/30/2022 1446   HGB 14.2 01/30/2022 1446   HGB 14.6 01/06/2014 0922   HCT 40.4 01/30/2022 1446   HCT 45.2 01/06/2014 0922   PLT 278 01/30/2022 1446   PLT 272 01/06/2014 0922   MCV 84.7 01/30/2022 1446   MCV 86.9 01/06/2014 0922   MCH 29.8 01/30/2022 1446   MCHC 35.1 01/30/2022 1446   RDW 12.4 01/30/2022 1446   RDW 14.1 01/06/2014 0922   LYMPHSABS 0.7 01/30/2022 1446   LYMPHSABS 1.4 01/06/2014 0922   MONOABS 0.3 01/30/2022 1446   MONOABS 0.5 01/06/2014 0922   EOSABS 0.0 01/30/2022 1446   EOSABS 0.3 01/06/2014 0922   EOSABS 0.2 01/08/2010 1022   BASOSABS 0.0 01/30/2022 1446   BASOSABS 0.1 01/06/2014 0922    CMP     Component Value Date/Time   NA 127 (L) 01/26/2022 0048   NA 137 01/06/2014 0922   K 4.0 01/26/2022 0048   K 3.9 01/06/2014 0922   CL 100 01/26/2022 0048   CL 103 01/08/2010 1022   CO2 21 (L)  01/26/2022 0048   CO2 23 01/06/2014 0922   GLUCOSE 148 (H) 01/26/2022 0048   GLUCOSE 151 (H) 01/06/2014 0922   GLUCOSE 144 (H) 01/08/2010 1022   BUN 21 01/26/2022 0048   BUN 13.6 01/06/2014 0922   CREATININE 1.11 01/26/2022 0048   CREATININE 1.15 03/10/2019 0809   CREATININE 1.2 01/06/2014 0922   CALCIUM 8.7 (L) 01/26/2022 0048   CALCIUM 9.4 01/06/2014 2633  PROT 8.0 01/26/2022 0048   PROT 6.5 01/06/2014 0922   ALBUMIN 2.7 (L) 01/26/2022 0048   ALBUMIN 3.6 01/06/2014 0922   AST 17 01/26/2022 0048   AST 17 03/10/2019 0809   AST 17 01/06/2014 0922   ALT 20 01/26/2022 0048   ALT 36 03/10/2019 0809   ALT 24 01/06/2014 0922   ALKPHOS 44 01/26/2022 0048   ALKPHOS 79 01/06/2014 0922   BILITOT 0.7 01/26/2022 0048   BILITOT 0.3 03/10/2019 0809   BILITOT 0.66 01/06/2014 0922   GFRNONAA >60 01/26/2022 0048   GFRNONAA >60 03/10/2019 0809   GFRAA >60 03/10/2019 0809       ASSESSMENT and THERAPY PLAN:   Acute ITP ([place]) Kenneth Juarez is a 61 year old man who is here today for follow-up from his recent hospitalization secondary to ITP.  He continues on prednisone 60 mg daily with good tolerance.  His platelet count today is 278.  I recommended that he continue on his current dose of prednisone at 60 mg and we will repeat his labs and have follow-up with him in 1 week.    Should he develop any new petechiae or easy bruising bleeding he will let us know.  Otherwise he seems to be doing quite well since his discharge.    All questions were answered. The patient knows to call the clinic with any problems, questions or concerns. We can certainly see the patient much sooner if necessary.  Total encounter time:30 minutes*in face-to-face visit time, chart review, lab review, care coordination, order entry, and documentation of the encounter time.    Wilber Bihari, NP 01/31/22 1:12 AM Medical Oncology and Hematology [facility] [place], [place]  [address] Tel. [phone]    Fax. [phone]  *Total Encounter Time as defined by the Centers for Medicare and Medicaid Services includes, in addition to the face-to-face time of a patient visit (documented in the note above) non-face-to-face time: obtaining and reviewing outside history, ordering and reviewing medications, tests or procedures, care coordination (communications with other health care professionals or caregivers) and documentation in the medical record.

## 2022-01-31 ENCOUNTER — Encounter: Payer: Self-pay | Admitting: Adult Health

## 2022-01-31 NOTE — Assessment & Plan Note (Signed)
Kenneth Juarez is a 61 year old man who is here today for follow-up from his recent hospitalization secondary to ITP.  He continues on prednisone 60 mg daily with good tolerance.  His platelet count today is 278.  I recommended that he continue on his current dose of prednisone at 60 mg and we will repeat his labs and have follow-up with him in 1 week.    Should he develop any new petechiae or easy bruising bleeding he will let us know.  Otherwise he seems to be doing quite well since his discharge.

## 2022-02-04 ENCOUNTER — Other Ambulatory Visit: Payer: Self-pay

## 2022-02-04 DIAGNOSIS — D693 Immune thrombocytopenic purpura: Secondary | ICD-10-CM

## 2022-02-04 NOTE — Progress Notes (Signed)
A user error has taken place: orders placed in error, not carried out on this patient.

## 2022-02-04 NOTE — Progress Notes (Signed)
Orders entered per np

## 2022-02-06 ENCOUNTER — Inpatient Hospital Stay: Payer: No Typology Code available for payment source | Attending: Adult Health | Admitting: Adult Health

## 2022-02-06 ENCOUNTER — Encounter: Payer: Self-pay | Admitting: Adult Health

## 2022-02-06 ENCOUNTER — Other Ambulatory Visit: Payer: Self-pay

## 2022-02-06 ENCOUNTER — Encounter: Payer: Self-pay | Admitting: *Deleted

## 2022-02-06 ENCOUNTER — Telehealth: Payer: Self-pay | Admitting: *Deleted

## 2022-02-06 ENCOUNTER — Inpatient Hospital Stay: Payer: No Typology Code available for payment source

## 2022-02-06 VITALS — BP 131/79 | HR 65 | Temp 97.9°F | Resp 14 | Ht 67.0 in | Wt 200.1 lb

## 2022-02-06 DIAGNOSIS — I10 Essential (primary) hypertension: Secondary | ICD-10-CM | POA: Diagnosis not present

## 2022-02-06 DIAGNOSIS — E785 Hyperlipidemia, unspecified: Secondary | ICD-10-CM | POA: Insufficient documentation

## 2022-02-06 DIAGNOSIS — Z87891 Personal history of nicotine dependence: Secondary | ICD-10-CM | POA: Diagnosis not present

## 2022-02-06 DIAGNOSIS — D693 Immune thrombocytopenic purpura: Secondary | ICD-10-CM | POA: Insufficient documentation

## 2022-02-06 LAB — CMP (CANCER CENTER ONLY)
ALT: 53 U/L — ABNORMAL HIGH (ref 0–44)
AST: 30 U/L (ref 15–41)
Albumin: 3.5 g/dL (ref 3.5–5.0)
Alkaline Phosphatase: 53 U/L (ref 38–126)
Anion gap: 5 (ref 5–15)
BUN: 19 mg/dL (ref 8–23)
CO2: 24 mmol/L (ref 22–32)
Calcium: 9 mg/dL (ref 8.9–10.3)
Chloride: 104 mmol/L (ref 98–111)
Creatinine: 1.29 mg/dL — ABNORMAL HIGH (ref 0.61–1.24)
GFR, Estimated: >60 mL/min (ref >60)
Glucose, Bld: 107 mg/dL — ABNORMAL HIGH (ref 70–99)
Potassium: 3.8 mmol/L (ref 3.5–5.1)
Sodium: 133 mmol/L — ABNORMAL LOW (ref 135–145)
Total Bilirubin: 0.7 mg/dL (ref 0.3–1.2)
Total Protein: 7.2 g/dL (ref 6.5–8.1)

## 2022-02-06 LAB — CBC WITH DIFFERENTIAL (CANCER CENTER ONLY)
Abs Immature Granulocytes: 0.06 10*3/uL (ref 0.00–0.07)
Basophils Absolute: 0 10*3/uL (ref 0.0–0.1)
Basophils Relative: 0 %
Eosinophils Absolute: 0 10*3/uL (ref 0.0–0.5)
Eosinophils Relative: 0 %
HCT: 41.7 % (ref 39.0–52.0)
Hemoglobin: 14.5 g/dL (ref 13.0–17.0)
Immature Granulocytes: 1 %
Lymphocytes Relative: 14 %
Lymphs Abs: 1.8 10*3/uL (ref 0.7–4.0)
MCH: 29.8 pg (ref 26.0–34.0)
MCHC: 34.8 g/dL (ref 30.0–36.0)
MCV: 85.6 fL (ref 80.0–100.0)
Monocytes Absolute: 1.1 10*3/uL — ABNORMAL HIGH (ref 0.1–1.0)
Monocytes Relative: 9 %
Neutro Abs: 9.4 10*3/uL — ABNORMAL HIGH (ref 1.7–7.7)
Neutrophils Relative %: 76 %
Platelet Count: 293 10*3/uL (ref 150–400)
RBC: 4.87 MIL/uL (ref 4.22–5.81)
RDW: 13.2 % (ref 11.5–15.5)
WBC Count: 12.4 10*3/uL — ABNORMAL HIGH (ref 4.0–10.5)
nRBC: 0 % (ref 0.0–0.2)

## 2022-02-06 NOTE — Telephone Encounter (Addendum)
-----   Message from Gardenia Phlegm, NP sent at 02/06/2022  8:43 AM EDT ----- Please fax these labs to patient's PCP, Dr. Rica Mote   Results from 9/7 CBC and CMP faxed to [name], MD w/ [facility].  Fax [phone] --Fax confirmation received

## 2022-02-06 NOTE — Progress Notes (Signed)
Followed-up with Kenneth Juarez concerning pt's FMLA papers. Called pt to inform him of the progress and also will have him to come in and sign release form on 02/07/2022. Pt agreed.

## 2022-02-06 NOTE — Assessment & Plan Note (Signed)
Rob is doing well today.  His platelet count has maintained stability with his dose reduction and he will continue prednisone 40 mg daily for the next week.  We will check labs weekly and he has follow-up with Dr. Lindi Adie on September 26.  He knows to call us if he develops any symptoms of thrombocytopenia such as easy bruising bleeding or petechiae.

## 2022-02-06 NOTE — Progress Notes (Signed)
[facility] Follow up:    Kenneth Amel, MD [place] [address] [address]   DIAGNOSIS: ITP  SUMMARY OF HEMATOLOGIC HISTORY: Three episodes of ITP in 2008, 2010, 2020 receiving IVIG and Prednisone in the past, most recent hospitalization in 2023 s/p IVIG and current Prednisone taper One episode of hemolytic anemia in 2014 treated with steroids  CURRENT THERAPY: Prednisone  INTERVAL HISTORY: Kenneth Juarez 61 y.o. male returns for follow-up of his ITP.  He has tapered down to prednisone at 40 mg daily and is tolerating it well.  His platelet count today is 293.  He has no new issues.  He is not having any stomach upset.  He did asked that we check on his FMLA.   Patient Active Problem List   Diagnosis Date Noted   Hyperlipidemia 01/25/2022   AKI (acute kidney injury) ([place]) 01/25/2022   Acute ITP ([place]) 01/27/2019   Depression with anxiety    Essential hypertension    Thrombocytopenia ([place]) 05/03/2013   Hemolytic anemia ([place]) 05/03/2013    has No Known Allergies.  MEDICAL HISTORY: Past Medical History:  Diagnosis Date   Depression    Hemolytic anemia ([place]) 05/03/2013   Hypertension    ITP (idiopathic thrombocytopenic purpura)     SURGICAL HISTORY: History reviewed. No pertinent surgical history.  SOCIAL HISTORY: Social History   Socioeconomic History   Marital status: Single    Spouse name: Not on file   Number of children: Not on file   Years of education: Not on file   Highest education level: Not on file  Occupational History   Not on file  Tobacco Use   Smoking status: Former    Packs/day: 2.00    Years: 5.00    Total pack years: 10.00    Types: Cigarettes    Quit date: 06/02/1996    Years since quitting: 25.6   Smokeless tobacco: Never  Substance and Sexual Activity   Alcohol use: No   Drug use: No   Sexual activity: Yes  Other Topics Concern   Not on file  Social History Narrative   Not on file    Social Determinants of Health   Financial Resource Strain: Not on file  Food Insecurity: Not on file  Transportation Needs: Not on file  Physical Activity: Not on file  Stress: Not on file  Social Connections: Not on file  Intimate Partner Violence: Not on file    FAMILY HISTORY: History reviewed. No pertinent family history.  Review of Systems  Constitutional:  Negative for appetite change, chills, fatigue, fever and unexpected weight change.  HENT:   Negative for hearing loss, lump/mass and trouble swallowing.   Eyes:  Negative for eye problems and icterus.  Respiratory:  Negative for chest tightness, cough and shortness of breath.   Cardiovascular:  Negative for chest pain, leg swelling and palpitations.  Gastrointestinal:  Negative for abdominal distention, abdominal pain, constipation, diarrhea, nausea and vomiting.  Endocrine: Negative for hot flashes.  Genitourinary:  Negative for difficulty urinating.   Musculoskeletal:  Negative for arthralgias.  Skin:  Negative for itching and rash.  Neurological:  Negative for dizziness, extremity weakness, headaches and numbness.  Hematological:  Negative for adenopathy. Does not bruise/bleed easily.  Psychiatric/Behavioral:  Negative for depression. The patient is not nervous/anxious.       PHYSICAL EXAMINATION  ECOG PERFORMANCE STATUS: 0 - Asymptomatic  Vitals:   02/06/22 0807  BP: 131/79  Pulse: 65  Resp: 14  Temp: 97.9 F (36.6 C)  SpO2: 98%    Physical Exam Constitutional:      General: He is not in acute distress.    Appearance: Normal appearance. He is not toxic-appearing.  HENT:     Head: Normocephalic and atraumatic.  Eyes:     General: No scleral icterus. Cardiovascular:     Rate and Rhythm: Normal rate and regular rhythm.     Pulses: Normal pulses.     Heart sounds: Normal heart sounds.  Pulmonary:     Effort: Pulmonary effort is normal.     Breath sounds: Normal breath sounds.  Abdominal:      General: Abdomen is flat. Bowel sounds are normal. There is no distension.     Palpations: Abdomen is soft.     Tenderness: There is no abdominal tenderness.  Musculoskeletal:        General: No swelling.     Cervical back: Neck supple.  Lymphadenopathy:     Cervical: No cervical adenopathy.  Skin:    General: Skin is warm and dry.     Findings: No rash.  Neurological:     General: No focal deficit present.     Mental Status: He is alert.  Psychiatric:        Mood and Affect: Mood normal.        Behavior: Behavior normal.     LABORATORY DATA:  CBC    Component Value Date/Time   WBC 12.4 (H) 02/06/2022 0736   WBC 8.7 01/26/2022 0048   RBC 4.87 02/06/2022 0736   HGB 14.5 02/06/2022 0736   HGB 14.6 01/06/2014 0922   HCT 41.7 02/06/2022 0736   HCT 45.2 01/06/2014 0922   PLT 293 02/06/2022 0736   PLT 272 01/06/2014 0922   MCV 85.6 02/06/2022 0736   MCV 86.9 01/06/2014 0922   MCH 29.8 02/06/2022 0736   MCHC 34.8 02/06/2022 0736   RDW 13.2 02/06/2022 0736   RDW 14.1 01/06/2014 0922   LYMPHSABS 1.8 02/06/2022 0736   LYMPHSABS 1.4 01/06/2014 0922   MONOABS 1.1 (H) 02/06/2022 0736   MONOABS 0.5 01/06/2014 0922   EOSABS 0.0 02/06/2022 0736   EOSABS 0.3 01/06/2014 0922   EOSABS 0.2 01/08/2010 1022   BASOSABS 0.0 02/06/2022 0736   BASOSABS 0.1 01/06/2014 0922    CMP     Component Value Date/Time   NA 133 (L) 02/06/2022 0736   NA 137 01/06/2014 0922   K 3.8 02/06/2022 0736   K 3.9 01/06/2014 0922   CL 104 02/06/2022 0736   CL 103 01/08/2010 1022   CO2 24 02/06/2022 0736   CO2 23 01/06/2014 0922   GLUCOSE 107 (H) 02/06/2022 0736   GLUCOSE 151 (H) 01/06/2014 0922   GLUCOSE 144 (H) 01/08/2010 1022   BUN 19 02/06/2022 0736   BUN 13.6 01/06/2014 0922   CREATININE 1.29 (H) 02/06/2022 0736   CREATININE 1.2 01/06/2014 0922   CALCIUM 9.0 02/06/2022 0736   CALCIUM 9.4 01/06/2014 0922   PROT 7.2 02/06/2022 0736   PROT 6.5 01/06/2014 0922   ALBUMIN 3.5 02/06/2022 0736    ALBUMIN 3.6 01/06/2014 0922   AST 30 02/06/2022 0736   AST 17 01/06/2014 0922   ALT 53 (H) 02/06/2022 0736   ALT 24 01/06/2014 0922   ALKPHOS 53 02/06/2022 0736   ALKPHOS 79 01/06/2014 0922   BILITOT 0.7 02/06/2022 0736   BILITOT 0.66 01/06/2014 0922   GFRNONAA >60 02/06/2022 0736   GFRAA >60 03/10/2019  0809       ASSESSMENT and THERAPY PLAN:   Acute ITP ([place]) Rob is doing well today.  His platelet count has maintained stability with his dose reduction and he will continue prednisone 40 mg daily for the next week.  We will check labs weekly and he has follow-up with Dr. Lindi Adie on September 26.  He knows to call us if he develops any symptoms of thrombocytopenia such as easy bruising bleeding or petechiae.    All questions were answered. The patient knows to call the clinic with any problems, questions or concerns. We can certainly see the patient much sooner if necessary.  Total encounter time:20 minutes*in face-to-face visit time, chart review, lab review, care coordination, order entry, and documentation of the encounter time.    Wilber Bihari, NP 02/06/22 1:15 PM Medical Oncology and Hematology [facility] [place], [place] [address] Tel. [phone]    Fax. [phone]  *Total Encounter Time as defined by the Centers for Medicare and Medicaid Services includes, in addition to the face-to-face time of a patient visit (documented in the note above) non-face-to-face time: obtaining and reviewing outside history, ordering and reviewing medications, tests or procedures, care coordination (communications with other health care professionals or caregivers) and documentation in the medical record.

## 2022-02-13 ENCOUNTER — Inpatient Hospital Stay: Payer: No Typology Code available for payment source

## 2022-02-13 ENCOUNTER — Telehealth: Payer: Self-pay | Admitting: *Deleted

## 2022-02-13 ENCOUNTER — Encounter: Payer: Self-pay | Admitting: Hematology and Oncology

## 2022-02-13 DIAGNOSIS — D693 Immune thrombocytopenic purpura: Secondary | ICD-10-CM | POA: Diagnosis not present

## 2022-02-13 LAB — CBC WITH DIFFERENTIAL (CANCER CENTER ONLY)
Abs Immature Granulocytes: 0.05 10*3/uL (ref 0.00–0.07)
Basophils Absolute: 0 10*3/uL (ref 0.0–0.1)
Basophils Relative: 0 %
Eosinophils Absolute: 0 10*3/uL (ref 0.0–0.5)
Eosinophils Relative: 0 %
HCT: 43.6 % (ref 39.0–52.0)
Hemoglobin: 14.7 g/dL (ref 13.0–17.0)
Immature Granulocytes: 1 %
Lymphocytes Relative: 10 %
Lymphs Abs: 0.7 10*3/uL (ref 0.7–4.0)
MCH: 30.1 pg (ref 26.0–34.0)
MCHC: 33.7 g/dL (ref 30.0–36.0)
MCV: 89.3 fL (ref 80.0–100.0)
Monocytes Absolute: 0.3 10*3/uL (ref 0.1–1.0)
Monocytes Relative: 4 %
Neutro Abs: 5.5 10*3/uL (ref 1.7–7.7)
Neutrophils Relative %: 85 %
Platelet Count: 240 10*3/uL (ref 150–400)
RBC: 4.88 MIL/uL (ref 4.22–5.81)
RDW: 13.4 % (ref 11.5–15.5)
WBC Count: 6.5 10*3/uL (ref 4.0–10.5)
nRBC: 0 % (ref 0.0–0.2)

## 2022-02-13 NOTE — Telephone Encounter (Signed)
[employer] paperwork completed by this nurse at this time.  Folder placed in designated mail bin for collaborative pick up for provider review, signature, and return.  Paperwork returned and received by this nurse today from collaborative signed by provider.  Successfully returned via fax.  Original in envelope addressed to Leory Plowman to alphabetical file folder for patient pick up near registrar number one of [facility] appointment registration work area.  Copy to bin designated for items to be scanned completes process.  Marvis Repress, Baker Janus notified and will leave message for claims manager to ensure receipt of paperwork faxed today.  No further instructions received, actions required or performed by this nurse.

## 2022-02-20 ENCOUNTER — Inpatient Hospital Stay: Payer: No Typology Code available for payment source

## 2022-02-20 ENCOUNTER — Encounter: Payer: Self-pay | Admitting: *Deleted

## 2022-02-20 DIAGNOSIS — D693 Immune thrombocytopenic purpura: Secondary | ICD-10-CM | POA: Diagnosis not present

## 2022-02-20 LAB — CBC WITH DIFFERENTIAL (CANCER CENTER ONLY)
Abs Immature Granulocytes: 0.01 10*3/uL (ref 0.00–0.07)
Basophils Absolute: 0 10*3/uL (ref 0.0–0.1)
Basophils Relative: 1 %
Eosinophils Absolute: 0 10*3/uL (ref 0.0–0.5)
Eosinophils Relative: 0 %
HCT: 41.3 % (ref 39.0–52.0)
Hemoglobin: 14.2 g/dL (ref 13.0–17.0)
Immature Granulocytes: 0 %
Lymphocytes Relative: 18 %
Lymphs Abs: 0.6 10*3/uL — ABNORMAL LOW (ref 0.7–4.0)
MCH: 30.4 pg (ref 26.0–34.0)
MCHC: 34.4 g/dL (ref 30.0–36.0)
MCV: 88.4 fL (ref 80.0–100.0)
Monocytes Absolute: 0.2 10*3/uL (ref 0.1–1.0)
Monocytes Relative: 6 %
Neutro Abs: 2.6 10*3/uL (ref 1.7–7.7)
Neutrophils Relative %: 75 %
Platelet Count: 189 10*3/uL (ref 150–400)
RBC: 4.67 MIL/uL (ref 4.22–5.81)
RDW: 13.4 % (ref 11.5–15.5)
WBC Count: 3.5 10*3/uL — ABNORMAL LOW (ref 4.0–10.5)
nRBC: 0 % (ref 0.0–0.2)

## 2022-02-24 NOTE — Progress Notes (Signed)
Patient Care Team: [name], MD as PCP - General (Family Medicine) Nicholas Lose, MD as PCP - Hematology/Oncology (Hematology and Oncology)  DIAGNOSIS: No diagnosis found.  SUMMARY OF ONCOLOGIC HISTORY: Oncology History   No history exists.    CHIEF COMPLIANT: Follow-up of ITP  INTERVAL HISTORY: Kenneth Juarez is a 61 y.o. with above-mentioned history of ITP. He presents to the clinic today for follow-up.    ALLERGIES:  has No Known Allergies.  MEDICATIONS:  Current Outpatient Medications  Medication Sig Dispense Refill   ALPRAZolam (XANAX) 0.5 MG tablet Take 0.25 mg by mouth 2 (two) times daily as needed for anxiety. (Patient not taking: Reported on 01/30/2022)     Ascorbic Acid (VITAMIN C) 500 MG CAPS Take 500 mg by mouth daily.     atenolol (TENORMIN) 25 MG tablet Take 25 mg by mouth daily.     atorvastatin (LIPITOR) 20 MG tablet Take 20 mg by mouth daily.     cholecalciferol (VITAMIN D3) 25 MCG (1000 UT) tablet Take 1,000 Units by mouth daily.     lisinopril-hydrochlorothiazide (ZESTORETIC) 10-12.5 MG tablet Take 1 tablet by mouth daily. HOLD until follow up with your doctor or BP consistently high (Patient not taking: Reported on 01/30/2022)     pantoprazole (PROTONIX) 40 MG tablet Take 1 tablet (40 mg total) by mouth daily. (Patient not taking: Reported on 02/06/2022) 30 tablet 3   PARoxetine (PAXIL) 20 MG tablet Take 20 mg by mouth daily.     predniSONE (DELTASONE) 20 MG tablet Take 3 tablets (60 mg total) by mouth daily with breakfast. (Patient taking differently: Take 60 mg by mouth daily with breakfast. Pt took 40 mg today 02/06/2022.) 120 tablet 1   No current facility-administered medications for this visit.    PHYSICAL EXAMINATION: ECOG PERFORMANCE STATUS: {CHL ONC ECOG [phone]}  There were no vitals filed for this visit. There were no vitals filed for this visit.  BREAST:*** No palpable masses or nodules in either right or left breasts. No palpable  axillary supraclavicular or infraclavicular adenopathy no breast tenderness or nipple discharge. (exam performed in the presence of a chaperone)  LABORATORY DATA:  I have reviewed the data as listed    Latest Ref Rng & Units 02/06/2022    7:36 AM 01/26/2022   12:48 AM 01/25/2022    6:31 AM  CMP  Glucose 70 - 99 mg/dL 107  148  136   BUN 8 - 23 mg/dL '19  21  16   '$ Creatinine 0.61 - 1.24 mg/dL 1.29  1.11  1.14   Sodium 135 - 145 mmol/L 133  127  130   Potassium 3.5 - 5.1 mmol/L 3.8  4.0  4.1   Chloride 98 - 111 mmol/L 104  100  101   CO2 22 - 32 mmol/L '24  21  22   '$ Calcium 8.9 - 10.3 mg/dL 9.0  8.7  9.0   Total Protein 6.5 - 8.1 g/dL 7.2  8.0  8.2   Total Bilirubin 0.3 - 1.2 mg/dL 0.7  0.7  1.3   Alkaline Phos 38 - 126 U/L 53  44  51   AST 15 - 41 U/L '30  17  21   '$ ALT 0 - 44 U/L 53  20  24     Lab Results  Component Value Date   WBC 3.5 (L) 02/20/2022   HGB 14.2 02/20/2022   HCT 41.3 02/20/2022   MCV 88.4 02/20/2022   PLT 189 02/20/2022  NEUTROABS 2.6 02/20/2022    ASSESSMENT & PLAN:  No problem-specific Assessment & Plan notes found for this encounter.    No orders of the defined types were placed in this encounter.  The patient has a good understanding of the overall plan. he agrees with it. he will call with any problems that may develop before the next visit here. Total time spent: 30 mins including face to face time and time spent for planning, charting and co-ordination of care   Suzzette Righter, [place] 02/24/22    I Gardiner Coins am scribing for Dr. Lindi Adie  ***

## 2022-02-25 ENCOUNTER — Inpatient Hospital Stay: Payer: No Typology Code available for payment source

## 2022-02-25 ENCOUNTER — Other Ambulatory Visit: Payer: Self-pay

## 2022-02-25 ENCOUNTER — Inpatient Hospital Stay: Payer: No Typology Code available for payment source | Admitting: Hematology and Oncology

## 2022-02-25 DIAGNOSIS — D693 Immune thrombocytopenic purpura: Secondary | ICD-10-CM

## 2022-02-25 LAB — CBC WITH DIFFERENTIAL (CANCER CENTER ONLY)
Abs Immature Granulocytes: 0.02 10*3/uL (ref 0.00–0.07)
Basophils Absolute: 0 10*3/uL (ref 0.0–0.1)
Basophils Relative: 1 %
Eosinophils Absolute: 0 10*3/uL (ref 0.0–0.5)
Eosinophils Relative: 0 %
HCT: 43.8 % (ref 39.0–52.0)
Hemoglobin: 15 g/dL (ref 13.0–17.0)
Immature Granulocytes: 0 %
Lymphocytes Relative: 13 %
Lymphs Abs: 0.8 10*3/uL (ref 0.7–4.0)
MCH: 30.3 pg (ref 26.0–34.0)
MCHC: 34.2 g/dL (ref 30.0–36.0)
MCV: 88.5 fL (ref 80.0–100.0)
Monocytes Absolute: 0.3 10*3/uL (ref 0.1–1.0)
Monocytes Relative: 5 %
Neutro Abs: 4.8 10*3/uL (ref 1.7–7.7)
Neutrophils Relative %: 81 %
Platelet Count: 229 10*3/uL (ref 150–400)
RBC: 4.95 MIL/uL (ref 4.22–5.81)
RDW: 13.2 % (ref 11.5–15.5)
WBC Count: 6 10*3/uL (ref 4.0–10.5)
nRBC: 0 % (ref 0.0–0.2)

## 2022-02-27 ENCOUNTER — Other Ambulatory Visit: Payer: Self-pay

## 2022-02-27 ENCOUNTER — Inpatient Hospital Stay: Payer: No Typology Code available for payment source | Admitting: Hematology and Oncology

## 2022-02-27 DIAGNOSIS — D693 Immune thrombocytopenic purpura: Secondary | ICD-10-CM

## 2022-02-27 NOTE — Assessment & Plan Note (Signed)
Prior ITP episodes: 2008, 2010, 2020, 2023 Diagnosed with a platelet count of 6 Treatment: IVIG in the hospital 01/28/2019 followed by prednisone taper completed October 2020  Lab review:  05/11/19: Platelet count 322  01/24/2022: Platelets 10 (started prednisone) 02/25/2022: Platelets 229  Continue to taper the steroids down Return to clinic

## 2022-03-19 NOTE — Telephone Encounter (Signed)
Sent to Provider on 10/17

## 2022-04-09 NOTE — Progress Notes (Signed)
Patient Care Team: [name], MD as PCP - General (Family Medicine) Nicholas Lose, MD as PCP - Hematology/Oncology (Hematology and Oncology)  DIAGNOSIS: No diagnosis found.  SUMMARY OF ONCOLOGIC HISTORY: Oncology History   No history exists.    CHIEF COMPLIANT: Follow-up of ITP   INTERVAL HISTORY: Kenneth Juarez is a 61 y.o. with above-mentioned history of ITP. He presents to the clinic today for labs and follow-up.   ALLERGIES:  has No Known Allergies.  MEDICATIONS:  Current Outpatient Medications  Medication Sig Dispense Refill   ALPRAZolam (XANAX) 0.5 MG tablet Take 0.25 mg by mouth 2 (two) times daily as needed for anxiety. (Patient not taking: Reported on 01/30/2022)     Ascorbic Acid (VITAMIN C) 500 MG CAPS Take 500 mg by mouth daily.     atenolol (TENORMIN) 25 MG tablet Take 25 mg by mouth daily.     atorvastatin (LIPITOR) 20 MG tablet Take 20 mg by mouth daily.     cholecalciferol (VITAMIN D3) 25 MCG (1000 UT) tablet Take 1,000 Units by mouth daily.     lisinopril-hydrochlorothiazide (ZESTORETIC) 10-12.5 MG tablet Take 1 tablet by mouth daily. HOLD until follow up with your doctor or BP consistently high (Patient not taking: Reported on 01/30/2022)     pantoprazole (PROTONIX) 40 MG tablet Take 1 tablet (40 mg total) by mouth daily. (Patient not taking: Reported on 02/06/2022) 30 tablet 3   PARoxetine (PAXIL) 20 MG tablet Take 20 mg by mouth daily.     predniSONE (DELTASONE) 20 MG tablet Take 3 tablets (60 mg total) by mouth daily with breakfast. (Patient taking differently: Take 60 mg by mouth daily with breakfast. Pt took 40 mg today 02/06/2022.) 120 tablet 1   No current facility-administered medications for this visit.    PHYSICAL EXAMINATION: ECOG PERFORMANCE STATUS: {CHL ONC ECOG [id number]}  There were no vitals filed for this visit. There were no vitals filed for this visit.  BREAST:*** No palpable masses or nodules in either right or left breasts. No  palpable axillary supraclavicular or infraclavicular adenopathy no breast tenderness or nipple discharge. (exam performed in the presence of a chaperone)  LABORATORY DATA:  I have reviewed the data as listed    Latest Ref Rng & Units 02/06/2022    7:36 AM 01/26/2022   12:48 AM 01/25/2022    6:31 AM  CMP  Glucose 70 - 99 mg/dL 107  148  136   BUN 8 - 23 mg/dL '19  21  16   '$ Creatinine 0.61 - 1.24 mg/dL 1.29  1.11  1.14   Sodium 135 - 145 mmol/L 133  127  130   Potassium 3.5 - 5.1 mmol/L 3.8  4.0  4.1   Chloride 98 - 111 mmol/L 104  100  101   CO2 22 - 32 mmol/L '24  21  22   '$ Calcium 8.9 - 10.3 mg/dL 9.0  8.7  9.0   Total Protein 6.5 - 8.1 g/dL 7.2  8.0  8.2   Total Bilirubin 0.3 - 1.2 mg/dL 0.7  0.7  1.3   Alkaline Phos 38 - 126 U/L 53  44  51   AST 15 - 41 U/L '30  17  21   '$ ALT 0 - 44 U/L 53  20  24     Lab Results  Component Value Date   WBC 6.0 02/25/2022   HGB 15.0 02/25/2022   HCT 43.8 02/25/2022   MCV 88.5 02/25/2022   PLT 229 02/25/2022  NEUTROABS 4.8 02/25/2022    ASSESSMENT & PLAN:  No problem-specific Assessment & Plan notes found for this encounter.    No orders of the defined types were placed in this encounter.  The patient has a good understanding of the overall plan. he agrees with it. he will call with any problems that may develop before the next visit here. Total time spent: 30 mins including face to face time and time spent for planning, charting and co-ordination of care   Suzzette Righter, [place] 04/09/22    I Gardiner Coins am scribing for Dr. Lindi Adie  ***

## 2022-04-10 ENCOUNTER — Inpatient Hospital Stay: Payer: No Typology Code available for payment source | Attending: Adult Health | Admitting: Hematology and Oncology

## 2022-04-10 ENCOUNTER — Inpatient Hospital Stay: Payer: No Typology Code available for payment source

## 2022-04-10 VITALS — BP 124/77 | HR 63 | Temp 97.5°F | Resp 18 | Ht 67.0 in | Wt 205.8 lb

## 2022-04-10 DIAGNOSIS — D693 Immune thrombocytopenic purpura: Secondary | ICD-10-CM

## 2022-04-10 DIAGNOSIS — Z79899 Other long term (current) drug therapy: Secondary | ICD-10-CM | POA: Insufficient documentation

## 2022-04-10 LAB — CBC WITH DIFFERENTIAL (CANCER CENTER ONLY)
Abs Immature Granulocytes: 0.02 10*3/uL (ref 0.00–0.07)
Basophils Absolute: 0.1 10*3/uL (ref 0.0–0.1)
Basophils Relative: 2 %
Eosinophils Absolute: 0.4 10*3/uL (ref 0.0–0.5)
Eosinophils Relative: 6 %
HCT: 43.1 % (ref 39.0–52.0)
Hemoglobin: 14.6 g/dL (ref 13.0–17.0)
Immature Granulocytes: 0 %
Lymphocytes Relative: 26 %
Lymphs Abs: 1.7 10*3/uL (ref 0.7–4.0)
MCH: 30.5 pg (ref 26.0–34.0)
MCHC: 33.9 g/dL (ref 30.0–36.0)
MCV: 90.2 fL (ref 80.0–100.0)
Monocytes Absolute: 0.8 10*3/uL (ref 0.1–1.0)
Monocytes Relative: 12 %
Neutro Abs: 3.5 10*3/uL (ref 1.7–7.7)
Neutrophils Relative %: 54 %
Platelet Count: 250 10*3/uL (ref 150–400)
RBC: 4.78 MIL/uL (ref 4.22–5.81)
RDW: 12.7 % (ref 11.5–15.5)
WBC Count: 6.4 10*3/uL (ref 4.0–10.5)
nRBC: 0 % (ref 0.0–0.2)

## 2022-04-10 NOTE — Assessment & Plan Note (Signed)
Diagnosed with a platelet count of 6 Treatment:  IVIG in the hospital 01/28/2019 followed by prednisone taper completed October 2020 Relapse 01/24/2022: Treated with prednisone   Lab review:  05/11/19: Platelet count 322  01/24/2022: Platelets 10 (started prednisone) possible cause of his ITP relapse could be Shingrix vaccine. 02/25/2022: Platelets 229 04/10/2022  Return to clinic in 3 months with labs and follow-up

## 2022-05-30 ENCOUNTER — Telehealth: Payer: Self-pay | Admitting: Hematology and Oncology

## 2022-05-30 NOTE — Telephone Encounter (Signed)
Rescheduled appointment per provider PAL. Patient is aware of the changes made to his upcoming appointment.

## 2022-06-13 ENCOUNTER — Inpatient Hospital Stay: Payer: No Typology Code available for payment source | Attending: Adult Health

## 2022-06-13 ENCOUNTER — Inpatient Hospital Stay: Payer: No Typology Code available for payment source | Admitting: Hematology and Oncology

## 2022-06-13 DIAGNOSIS — D693 Immune thrombocytopenic purpura: Secondary | ICD-10-CM | POA: Diagnosis present

## 2022-06-13 LAB — CBC WITH DIFFERENTIAL (CANCER CENTER ONLY)
Abs Immature Granulocytes: 0.01 10*3/uL (ref 0.00–0.07)
Basophils Absolute: 0.1 10*3/uL (ref 0.0–0.1)
Basophils Relative: 2 %
Eosinophils Absolute: 0.3 10*3/uL (ref 0.0–0.5)
Eosinophils Relative: 5 %
HCT: 45.9 % (ref 39.0–52.0)
Hemoglobin: 15.6 g/dL (ref 13.0–17.0)
Immature Granulocytes: 0 %
Lymphocytes Relative: 24 %
Lymphs Abs: 1.6 10*3/uL (ref 0.7–4.0)
MCH: 30.5 pg (ref 26.0–34.0)
MCHC: 34 g/dL (ref 30.0–36.0)
MCV: 89.6 fL (ref 80.0–100.0)
Monocytes Absolute: 0.8 10*3/uL (ref 0.1–1.0)
Monocytes Relative: 11 %
Neutro Abs: 3.9 10*3/uL (ref 1.7–7.7)
Neutrophils Relative %: 58 %
Platelet Count: 290 10*3/uL (ref 150–400)
RBC: 5.12 MIL/uL (ref 4.22–5.81)
RDW: 11.9 % (ref 11.5–15.5)
WBC Count: 6.7 10*3/uL (ref 4.0–10.5)
nRBC: 0 % (ref 0.0–0.2)

## 2022-06-17 ENCOUNTER — Inpatient Hospital Stay: Payer: No Typology Code available for payment source | Admitting: Hematology and Oncology

## 2022-06-19 ENCOUNTER — Inpatient Hospital Stay: Payer: No Typology Code available for payment source | Admitting: Hematology and Oncology

## 2022-06-19 DIAGNOSIS — D693 Immune thrombocytopenic purpura: Secondary | ICD-10-CM

## 2022-06-19 NOTE — Progress Notes (Signed)
HEMATOLOGY-ONCOLOGY TELEPHONE VISIT PROGRESS NOTE  I connected with our patient on 06/19/22 at  8:30 AM EST by telephone and verified that I am speaking with the correct person using two identifiers.  I discussed the limitations, risks, security and privacy concerns of performing an evaluation and management service by telephone and the availability of in person appointments.  I also discussed with the patient that there may be a patient responsible charge related to this service. The patient expressed understanding and agreed to proceed.   History of Present Illness: Telephone visit to discuss the results of recently performed blood work for ITP.  He reports no bruising or bleeding symptoms he feels relatively well.   REVIEW OF SYSTEMS:   Constitutional: Denies fevers, chills or abnormal weight loss All other systems were reviewed with the patient and are negative. Observations/Objective:     Assessment Plan:  Idiopathic thrombocytopenic purpura ([place]) Diagnosed with a platelet count of 6 Treatment:  IVIG in the hospital 01/28/2019 followed by prednisone taper completed October 2020 Relapse 01/24/2022: Treated with prednisone, prednisone tapered off 04/02/2022   Lab review:  05/11/19: Platelet count 322  01/24/2022: Platelets 10 (started prednisone) possible cause of his ITP relapse could be Shingrix vaccine. 02/25/2022: Platelets 229 04/10/2022: Platelets 250 06/13/2022: Platelets 290   Return to clinic on an as needed basis.    I discussed the assessment and treatment plan with the patient. The patient was provided an opportunity to ask questions and all were answered. The patient agreed with the plan and demonstrated an understanding of the instructions. The patient was advised to call back or seek an in-person evaluation if the symptoms worsen or if the condition fails to improve as anticipated.   I provided 12 minutes of non-face-to-face time during this encounter.  This includes time  for charting and coordination of care   Harriette Ohara, MD

## 2022-06-19 NOTE — Assessment & Plan Note (Addendum)
Diagnosed with a platelet count of 6 Treatment:  IVIG in the hospital 01/28/2019 followed by prednisone taper completed October 2020 Relapse 01/24/2022: Treated with prednisone, prednisone tapered off 04/02/2022   Lab review:  05/11/19: Platelet count 322  01/24/2022: Platelets 10 (started prednisone) possible cause of his ITP relapse could be Shingrix vaccine. 02/25/2022: Platelets 229 04/10/2022: Platelets 250 06/13/2022: Platelets 290   Return to clinic in 4 months with labs and follow-up
